# Patient Record
Sex: Female | Born: 1975 | Hispanic: No | Marital: Single | Smoking: Never smoker
Health system: Southern US, Community
[De-identification: ages and names within clinical notes are randomized; demographics above are authoritative.]

## PROBLEM LIST (undated history)

## (undated) DIAGNOSIS — Z8541 Personal history of malignant neoplasm of cervix uteri: Secondary | ICD-10-CM

## (undated) DIAGNOSIS — S62628A Displaced fracture of medial phalanx of other finger, initial encounter for closed fracture: Secondary | ICD-10-CM

## (undated) DIAGNOSIS — J45909 Unspecified asthma, uncomplicated: Secondary | ICD-10-CM

## (undated) DIAGNOSIS — D649 Anemia, unspecified: Secondary | ICD-10-CM

## (undated) DIAGNOSIS — G43909 Migraine, unspecified, not intractable, without status migrainosus: Secondary | ICD-10-CM

## (undated) DIAGNOSIS — Z8709 Personal history of other diseases of the respiratory system: Secondary | ICD-10-CM

## (undated) HISTORY — DX: Personal history of malignant neoplasm of cervix uteri: Z85.41

## (undated) HISTORY — PX: CERVICAL BIOPSY  W/ LOOP ELECTRODE EXCISION: SUR135

## (undated) HISTORY — PX: TUBAL LIGATION: SHX77

## (undated) HISTORY — PX: CHOLECYSTECTOMY: SHX55

---

## 2002-09-16 ENCOUNTER — Encounter: Payer: Self-pay | Admitting: Emergency Medicine

## 2002-09-16 ENCOUNTER — Emergency Department (HOSPITAL_COMMUNITY): Admission: EM | Admit: 2002-09-16 | Discharge: 2002-09-16 | Payer: Self-pay

## 2002-11-12 ENCOUNTER — Ambulatory Visit (HOSPITAL_COMMUNITY): Admission: RE | Admit: 2002-11-12 | Discharge: 2002-11-12 | Payer: Self-pay | Admitting: *Deleted

## 2008-08-14 ENCOUNTER — Emergency Department (HOSPITAL_COMMUNITY): Admission: EM | Admit: 2008-08-14 | Discharge: 2008-08-14 | Payer: Self-pay | Admitting: Emergency Medicine

## 2010-07-20 LAB — POCT I-STAT, CHEM 8
BUN: 9 mg/dL (ref 6–23)
Calcium, Ion: 1.17 mmol/L (ref 1.12–1.32)
Creatinine, Ser: 1 mg/dL (ref 0.4–1.2)
Glucose, Bld: 91 mg/dL (ref 70–99)
HCT: 35 % — ABNORMAL LOW (ref 36.0–46.0)
Hemoglobin: 11.9 g/dL — ABNORMAL LOW (ref 12.0–15.0)
TCO2: 25 mmol/L (ref 0–100)

## 2010-12-03 ENCOUNTER — Emergency Department (HOSPITAL_COMMUNITY)
Admission: EM | Admit: 2010-12-03 | Discharge: 2010-12-03 | Disposition: A | Discharge status: Home / Self Care | Payer: Medicaid Other | Attending: Emergency Medicine | Admitting: Emergency Medicine

## 2010-12-03 DIAGNOSIS — S335XXA Sprain of ligaments of lumbar spine, initial encounter: Secondary | ICD-10-CM | POA: Insufficient documentation

## 2010-12-03 DIAGNOSIS — X58XXXA Exposure to other specified factors, initial encounter: Secondary | ICD-10-CM | POA: Insufficient documentation

## 2010-12-03 DIAGNOSIS — J45909 Unspecified asthma, uncomplicated: Secondary | ICD-10-CM | POA: Insufficient documentation

## 2010-12-03 DIAGNOSIS — M545 Low back pain, unspecified: Secondary | ICD-10-CM | POA: Insufficient documentation

## 2012-04-20 ENCOUNTER — Emergency Department (HOSPITAL_COMMUNITY)
Admission: EM | Admit: 2012-04-20 | Discharge: 2012-04-20 | Disposition: A | Discharge status: Home / Self Care | Payer: Medicaid Other | Attending: Emergency Medicine | Admitting: Emergency Medicine

## 2012-04-20 ENCOUNTER — Encounter (HOSPITAL_COMMUNITY): Payer: Self-pay

## 2012-04-20 ENCOUNTER — Emergency Department (HOSPITAL_COMMUNITY): Payer: Medicaid Other

## 2012-04-20 DIAGNOSIS — J069 Acute upper respiratory infection, unspecified: Secondary | ICD-10-CM | POA: Insufficient documentation

## 2012-04-20 DIAGNOSIS — R5381 Other malaise: Secondary | ICD-10-CM | POA: Insufficient documentation

## 2012-04-20 DIAGNOSIS — R0602 Shortness of breath: Secondary | ICD-10-CM | POA: Insufficient documentation

## 2012-04-20 DIAGNOSIS — J3489 Other specified disorders of nose and nasal sinuses: Secondary | ICD-10-CM | POA: Insufficient documentation

## 2012-04-20 DIAGNOSIS — J029 Acute pharyngitis, unspecified: Secondary | ICD-10-CM | POA: Insufficient documentation

## 2012-04-20 DIAGNOSIS — R062 Wheezing: Secondary | ICD-10-CM | POA: Insufficient documentation

## 2012-04-20 DIAGNOSIS — R51 Headache: Secondary | ICD-10-CM | POA: Insufficient documentation

## 2012-04-20 DIAGNOSIS — R6889 Other general symptoms and signs: Secondary | ICD-10-CM

## 2012-04-20 DIAGNOSIS — R0789 Other chest pain: Secondary | ICD-10-CM | POA: Insufficient documentation

## 2012-04-20 MED ORDER — ALBUTEROL SULFATE HFA 108 (90 BASE) MCG/ACT IN AERS: 2.0000 | INHALATION_SPRAY | RESPIRATORY_TRACT | Status: DC | PRN

## 2012-04-20 MED ORDER — HYDROCODONE-HOMATROPINE 5-1.5 MG/5ML PO SYRP: 5.0000 mL | ORAL_SOLUTION | Freq: Four times a day (QID) | ORAL | Status: DC | PRN

## 2012-04-20 MED ORDER — GUAIFENESIN ER 600 MG PO TB12: 1200.0000 mg | ORAL_TABLET | Freq: Two times a day (BID) | ORAL | Status: DC

## 2012-04-20 MED ORDER — ALBUTEROL SULFATE (5 MG/ML) 0.5% IN NEBU
5.0000 mg | INHALATION_SOLUTION | Freq: Once | RESPIRATORY_TRACT | Status: AC
  Administered 2012-04-20: 5 mg via RESPIRATORY_TRACT
  Filled 2012-04-20: qty 1

## 2012-04-20 MED ORDER — IPRATROPIUM BROMIDE 0.03 % NA SOLN: 2.0000 | Freq: Two times a day (BID) | NASAL | Status: DC

## 2012-04-20 NOTE — ED Notes (Signed)
Pt. Reports that yesterday she has developed chest tightness, cough. Sore throat, ears feel like going to pop.   She has a hx of asthma and bronchitis. Pt. Is in NAD

## 2012-04-20 NOTE — ED Provider Notes (Signed)
History     CSN: [id number]  Arrival date & time 04/20/12  4098   First MD Initiated Contact with Patient 04/20/12 [phone]      Chief Complaint  Patient presents with  . Cough    (Consider location/radiation/quality/duration/timing/severity/associated sxs/prior treatment) The history is provided by the patient, the spouse and medical records.    Carla Barton is a 37 y.o. female  with a hx of asthma presents to the Emergency Department complaining of gradual, persistent, progressively worsening cough, congestion onset yesterday at 7 pm. Associated symptoms include chest tightness, scratchy throat (not sore), pressure in ears, headache, generalized body aches.  Pt states she works in [employer] ge[employer] and has been exposed to many sick patients.  Nothing makes it better and coughing and moving makes it worse.  Pt denies fever, neck pain, back pain, nausea, vomiting, diarrhea, chest pain, abdominal pain, weakness, dizziness, syncope, rash, dysuria, hematuria.     Past Medical History  Diagnosis Date  . Arthritis   . Bronchitis     Past Surgical History  Procedure Date  . Tubular ligation   . Cholecystectomy     No family history on file.  History  Substance Use Topics  . Smoking status: Never Smoker   . Smokeless tobacco: Not on file  . Alcohol Use: No    OB History    Grav Para Term Preterm Abortions TAB SAB Ect Mult Living                  Review of Systems  Constitutional: Positive for fatigue. Negative for fever, chills and appetite change.  HENT: Positive for congestion, sore throat, rhinorrhea, postnasal drip and sinus pressure. Negative for mouth sores and neck stiffness.   Eyes: Negative for visual disturbance.  Respiratory: Positive for cough, chest tightness, shortness of breath and wheezing. Negative for stridor.   Cardiovascular: Negative for chest pain, palpitations and leg swelling.  Gastrointestinal: Negative for nausea, vomiting, abdominal pain and diarrhea.    Genitourinary: Negative for dysuria, urgency, frequency and hematuria.  Musculoskeletal: Negative for myalgias, back pain and arthralgias.  Skin: Negative for rash.  Neurological: Positive for headaches. Negative for syncope, light-headedness and numbness.  Hematological: Negative for adenopathy.  Psychiatric/Behavioral: The patient is not nervous/anxious.   All other systems reviewed and are negative.    Allergies  Shellfish allergy  Home Medications   Current Outpatient Rx  Name  Route  Sig  Dispense  Refill  . ALBUTEROL SULFATE HFA 108 (90 BASE) MCG/ACT IN AERS   Inhalation   Inhale 2 puffs into the lungs every 4 (four) hours as needed for wheezing or shortness of breath.   1 Inhaler   3   . GUAIFENESIN ER 600 MG PO TB12   Oral   Take 2 tablets (1,200 mg total) by mouth 2 (two) times daily.   20 tablet   0   . HYDROCODONE-HOMATROPINE 5-1.5 MG/5ML PO SYRP   Oral   Take 5 mLs by mouth every 6 (six) hours as needed for cough.   120 mL   0   . IPRATROPIUM BROMIDE 0.03 % NA SOLN   Nasal   Place 2 sprays into the nose 2 (two) times daily. PRN congestion   30 mL   0     BP 112/71  Pulse 102  Temp 98.4 F (36.9 C) (Oral)  Resp 20  SpO2 100%  LMP 04/02/2012  Physical Exam  Constitutional: She is oriented to person, place, and time. She  appears well-developed and well-nourished. No distress.  HENT:  Head: Normocephalic and atraumatic.  Right Ear: Tympanic membrane, external ear and ear canal normal.  Left Ear: Tympanic membrane, external ear and ear canal normal.  Nose: Mucosal edema and rhinorrhea present. No epistaxis. Right sinus exhibits no maxillary sinus tenderness and no frontal sinus tenderness. Left sinus exhibits no maxillary sinus tenderness and no frontal sinus tenderness.  Mouth/Throat: Uvula is midline, oropharynx is clear and moist and mucous membranes are normal. Mucous membranes are not pale and not cyanotic. No oropharyngeal exudate, posterior  oropharyngeal edema, posterior oropharyngeal erythema or tonsillar abscesses.  Eyes: Conjunctivae normal are normal. Pupils are equal, round, and reactive to light.  Neck: Normal range of motion and full passive range of motion without pain.  Cardiovascular: Normal rate, normal heart sounds and intact distal pulses.  Exam reveals no gallop and no friction rub.   No murmur heard. Pulmonary/Chest: Effort normal. No stridor. No respiratory distress. She has decreased breath sounds (throughout). She has no wheezes. She has no rales. She exhibits tenderness (throughout the chest wall ).  Abdominal: Soft. Bowel sounds are normal. She exhibits no distension. There is no tenderness. There is no rebound and no guarding.  Musculoskeletal: Normal range of motion. She exhibits no edema and no tenderness.  Lymphadenopathy:    She has no cervical adenopathy.  Neurological: She is alert and oriented to person, place, and time. No cranial nerve deficit. She exhibits normal muscle tone. Coordination normal.  Skin: Skin is warm and dry. No rash noted. She is not diaphoretic.  Psychiatric: She has a normal mood and affect.    ED Course  Procedures (including critical care time)  Labs Reviewed - No data to display Dg Chest 2 View  04/20/2012  *RADIOLOGY REPORT*  Clinical Data: Cough  CHEST - 2 VIEW  Comparison: None.  Findings: Normal heart size.  Clear lungs.  No pleural effusion. No pneumothorax. Mild pectus excavatum deformity.  IMPRESSION: No active cardiopulmonary disease.   Original Report Authenticated By: Jolaine Click, M.D.      1. URI (upper respiratory infection)   2. Flu-like symptoms       MDM  Carla Barton presents with URI ssx.  Pt CXR negative for acute infiltrate. Patients symptoms are consistent with URI, likely viral etiology. Pt given albuterol treatment with subjective improvement in breathing and increased tidal volume.  Discussed that antibiotics are not indicated for viral  infections. Pt will be discharged with symptomatic treatment.  Verbalizes understanding and is agreeable with plan. Pt is hemodynamically stable & in NAD prior to dc.         Dahlia Client Tzippy Testerman, PA-C 04/20/12 1010

## 2012-04-20 NOTE — ED Provider Notes (Signed)
Medical screening examination/treatment/procedure(s) were performed by non-physician practitioner and as supervising physician I was immediately available for consultation/collaboration.   Niel Peretti, MD 04/20/12 1656 

## 2012-12-17 ENCOUNTER — Emergency Department (HOSPITAL_COMMUNITY)
Admission: EM | Admit: 2012-12-17 | Discharge: 2012-12-17 | Disposition: A | Discharge status: Home / Self Care | Payer: Medicaid Other | Attending: Emergency Medicine | Admitting: Emergency Medicine

## 2012-12-17 ENCOUNTER — Encounter (HOSPITAL_COMMUNITY): Payer: Self-pay | Admitting: Emergency Medicine

## 2012-12-17 DIAGNOSIS — M546 Pain in thoracic spine: Secondary | ICD-10-CM | POA: Insufficient documentation

## 2012-12-17 DIAGNOSIS — Z79899 Other long term (current) drug therapy: Secondary | ICD-10-CM | POA: Insufficient documentation

## 2012-12-17 DIAGNOSIS — M542 Cervicalgia: Secondary | ICD-10-CM | POA: Insufficient documentation

## 2012-12-17 DIAGNOSIS — M129 Arthropathy, unspecified: Secondary | ICD-10-CM | POA: Insufficient documentation

## 2012-12-17 MED ORDER — CYCLOBENZAPRINE HCL 5 MG PO TABS: 5.0000 mg | ORAL_TABLET | Freq: Three times a day (TID) | ORAL | Status: DC | PRN

## 2012-12-17 MED ORDER — NAPROXEN 500 MG PO TABS: 500.0000 mg | ORAL_TABLET | Freq: Two times a day (BID) | ORAL | Status: DC

## 2012-12-17 NOTE — ED Provider Notes (Signed)
CSN: [id number]     Arrival date & time 12/17/12  0830 History   First MD Initiated Contact with Patient 12/17/12 0957     Chief Complaint  Patient presents with  . Neck Pain    HPI  Carla Barton is a 37 y.o. female with a PMH of arthritis and bronchitis who presents to the ED for evaluation of neck pain.  History was provided by the patient.  Patient states that yesterday she woke up with left aching neck pain which radiates to her left upper back. No injury or trauma. She states that when she stands still that her pain is well-controlled. She states that when she moves her left shoulder or left neck her pain is worsened. She denies any neck swelling, sore throat, ear pain, chest pain, shortness of breath, diaphoresis, numbness, tingling, loss of sensation, or weakness. She states that she has had similar pain in her lower back in the past.  She states that she took extra strength Tylenol and naproxen with minimal relief of her symptoms. She states that she in the past has taken muscle relaxers and had success. She otherwise has been well with no fever, chills, rhinorrhea, cough, abdominal pain, nausea, vomiting, diarrhea, constipation, or leg edema.     Past Medical History  Diagnosis Date  . Arthritis   . Bronchitis    Past Surgical History  Procedure Laterality Date  . Tubular ligation    . Cholecystectomy     No family history on file. History  Substance Use Topics  . Smoking status: Never Smoker   . Smokeless tobacco: Not on file  . Alcohol Use: No   OB History   Grav Para Term Preterm Abortions TAB SAB Ect Mult Living                 Review of Systems  Constitutional: Negative for fever, chills, activity change, appetite change and fatigue.  HENT: Positive for neck pain and neck stiffness. Negative for ear pain, congestion, sore throat, facial swelling, rhinorrhea, drooling, trouble swallowing, voice change and sinus pressure.   Eyes: Negative for photophobia.   Respiratory: Negative for cough and shortness of breath.   Cardiovascular: Negative for chest pain and leg swelling.  Gastrointestinal: Negative for nausea, vomiting, abdominal pain, diarrhea and constipation.  Genitourinary: Negative for dysuria and hematuria.  Musculoskeletal: Positive for back pain (upper). Negative for gait problem.  Skin: Negative for wound.  Neurological: Negative for dizziness, syncope, weakness, light-headedness, numbness and headaches.  Psychiatric/Behavioral: Negative for confusion.    Allergies  Shellfish allergy  Home Medications   Current Outpatient Rx  Name  Route  Sig  Dispense  Refill  . acetaminophen (TYLENOL) 500 MG tablet   Oral   Take 1,000 mg by mouth every 6 (six) hours as needed for pain.         Marland Kitchen albuterol (PROVENTIL HFA;VENTOLIN HFA) 108 (90 BASE) MCG/ACT inhaler   Inhalation   Inhale 2 puffs into the lungs every 4 (four) hours as needed for wheezing or shortness of breath.   1 Inhaler   3   . naproxen (NAPROSYN) 500 MG tablet   Oral   Take 500 mg by mouth 2 (two) times daily as needed (for pain).          BP 165/102  Pulse 71  Temp(Src) 97 F (36.1 C) (Oral)  SpO2 100%  LMP 12/02/2012  Filed Vitals:   12/17/12 0850 12/17/12 1022  BP: 165/102 140/96  Pulse:  71 61  Temp: 97 F (36.1 C)   TempSrc: Oral   SpO2: 100% 100%    Physical Exam  Nursing note and vitals reviewed. Constitutional: She is oriented to person, place, and time. She appears well-developed and well-nourished. No distress.  Patient non-toxic in appearance   HENT:  Head: Normocephalic and atraumatic.  Right Ear: External ear normal.  Left Ear: External ear normal.  Nose: Nose normal.  Mouth/Throat: Oropharynx is clear and moist. No oropharyngeal exudate.  Tm's gray and translucent bilaterally  Eyes: Conjunctivae are normal. Pupils are equal, round, and reactive to light. Right eye exhibits no discharge. Left eye exhibits no discharge.  Neck:  Neck supple.  Diffuse tenderness to palpation to the left trapezius throughout.  No thoracic or cervical spinal tenderness.  No palpable masses to the neck throughout.  Neck is symmetrical without any edema, erythema, ecchymosis, or wounds. ROM limited due to pain.    Cardiovascular: Normal rate, regular rhythm, normal heart sounds and intact distal pulses.  Exam reveals no gallop and no friction rub.   No murmur heard. Radial pulses present and equal bilaterally  Pulmonary/Chest: Effort normal and breath sounds normal. No respiratory distress. She has no wheezes. She has no rales. She exhibits no tenderness.  Abdominal: Soft. Bowel sounds are normal. She exhibits no distension. There is no tenderness.  Musculoskeletal: Normal range of motion. She exhibits no edema and no tenderness.  Grip strength 5/5 bilaterally.  Left shoulder ROM limited due to left neck pain.    Neurological: She is alert and oriented to person, place, and time.  Gross sensation intact in the upper extremities bilaterally  Skin: Skin is warm and dry. She is not diaphoretic.        ED Course  Procedures (including critical care time) Labs Review Labs Reviewed - No data to display Imaging Review No results found.   MDM   1. Neck pain on left side      Carla Barton is a 37 y.o. female with a PMH of arthritis and bronchitis who presents to the ED for evaluation of neck pain.     Etiology of neck pain is possible due to a muscle strain.  Patient was neurovascularly intact.  She had diffuse muscular tenderness to palpation, which is worse with movement.  No x-rays were indicated due to no hx of trauma.  She was afebrile in the ED, non-toxic, and remained in no acute distress throughout her ED.  She was prescribed flexeril and naprosyn for outpatient management.  She was in instructed not to drink alcohol or drive while taking flexeril.  She was instructed to return to the ED if she experiences any fever,  numbness/tingling, loss of sensation, weakness, difficulty breathing/swallowing, or other concerns.  She was instructed to follow-up with her PCP this week if her symptoms are not improving in 2 days.  Patient was in agreement with discharge and plan.     Final impressions: 1. Neck pain, left     Greer Ee Sherol Sabas PA-C           Jillyn Ledger, PA-C 12/18/12 1015

## 2012-12-17 NOTE — ED Notes (Signed)
Pt c/o left neck and shoulder pain. Onset yesterday while sitting. No known injury.

## 2012-12-19 NOTE — ED Provider Notes (Signed)
Medical screening examination/treatment/procedure(s) were performed by non-physician practitioner and as supervising physician I was immediately available for consultation/collaboration.   David Masneri, MD 12/19/12 1844 

## 2012-12-28 ENCOUNTER — Emergency Department (HOSPITAL_COMMUNITY)
Admission: EM | Admit: 2012-12-28 | Discharge: 2012-12-28 | Disposition: A | Discharge status: Home / Self Care | Payer: Medicaid Other | Attending: Emergency Medicine | Admitting: Emergency Medicine

## 2012-12-28 ENCOUNTER — Encounter (HOSPITAL_COMMUNITY): Payer: Self-pay | Admitting: Emergency Medicine

## 2012-12-28 DIAGNOSIS — H9319 Tinnitus, unspecified ear: Secondary | ICD-10-CM | POA: Insufficient documentation

## 2012-12-28 DIAGNOSIS — M129 Arthropathy, unspecified: Secondary | ICD-10-CM | POA: Insufficient documentation

## 2012-12-28 DIAGNOSIS — J329 Chronic sinusitis, unspecified: Secondary | ICD-10-CM | POA: Insufficient documentation

## 2012-12-28 DIAGNOSIS — Z791 Long term (current) use of non-steroidal anti-inflammatories (NSAID): Secondary | ICD-10-CM | POA: Insufficient documentation

## 2012-12-28 DIAGNOSIS — Z8669 Personal history of other diseases of the nervous system and sense organs: Secondary | ICD-10-CM | POA: Insufficient documentation

## 2012-12-28 DIAGNOSIS — Z79899 Other long term (current) drug therapy: Secondary | ICD-10-CM | POA: Insufficient documentation

## 2012-12-28 MED ORDER — AMOXICILLIN 500 MG PO CAPS: 500.0000 mg | ORAL_CAPSULE | Freq: Three times a day (TID) | ORAL | Status: DC

## 2012-12-28 NOTE — ED Notes (Signed)
Pt reports having a head cold since Monday and ringing in left ear onset yesterday. Pt reports that her daughter was ill Friday and Saturday.

## 2012-12-28 NOTE — ED Provider Notes (Signed)
CSN: [id number]     Arrival date & time 12/28/12  0804 History   First MD Initiated Contact with Patient 12/28/12 0818     Chief Complaint  Patient presents with  . URI  . Tinnitus   (Consider location/radiation/quality/duration/timing/severity/associated sxs/prior Treatment) HPI Comments: Patient presents emergency department with chief complaint of sinus congestion and left ear tinnitus. She states that her symptoms began on Monday, and have progressively worsened. Associated symptoms include nasal congestion, runny nose, sore throat, and cough. She has tried taking Sudafed with some relief. He states that when she woke this morning she had a feeling of ear fullness and tinnitus. Her symptoms are mild to moderate.  The history is provided by the patient. No language interpreter was used.    Past Medical History  Diagnosis Date  . Arthritis   . Bronchitis    Past Surgical History  Procedure Laterality Date  . Tubular ligation    . Cholecystectomy     No family history on file. History  Substance Use Topics  . Smoking status: Never Smoker   . Smokeless tobacco: Not on file  . Alcohol Use: No   OB History   Grav Para Term Preterm Abortions TAB SAB Ect Mult Living                 Review of Systems  All other systems reviewed and are negative.    Allergies  Shellfish allergy and Asa  Home Medications   Current Outpatient Rx  Name  Route  Sig  Dispense  Refill  . acetaminophen (TYLENOL) 500 MG tablet   Oral   Take 1,000 mg by mouth every 6 (six) hours as needed for pain.         Marland Kitchen albuterol (PROVENTIL HFA;VENTOLIN HFA) 108 (90 BASE) MCG/ACT inhaler   Inhalation   Inhale 2 puffs into the lungs every 4 (four) hours as needed for wheezing or shortness of breath.   1 Inhaler   3   . cyclobenzaprine (FLEXERIL) 5 MG tablet   Oral   Take 1 tablet (5 mg total) by mouth 3 (three) times daily as needed for muscle spasms.   15 tablet   0   . naproxen (NAPROSYN) 500  MG tablet   Oral   Take 1 tablet (500 mg total) by mouth 2 (two) times daily with a meal.   15 tablet   0   . pseudoephedrine (SUDAFED) 30 MG tablet   Oral   Take 60 mg by mouth every 4 (four) hours as needed for congestion.         Marland Kitchen amoxicillin (AMOXIL) 500 MG capsule   Oral   Take 1 capsule (500 mg total) by mouth 3 (three) times daily.   30 capsule   0    BP 144/98  Pulse 86  Temp(Src) 97.8 F (36.6 C) (Oral)  Resp 22  Ht 5\' 7"  (1.702 m)  Wt 271 lb 4 oz (123.038 kg)  BMI 42.47 kg/m2  SpO2 100%  LMP 12/02/2012 Physical Exam  Nursing note and vitals reviewed. Constitutional: She is oriented to person, place, and time. She appears well-developed and well-nourished.  HENT:  Head: Normocephalic and atraumatic.  Right Ear: External ear normal.  Left Ear: External ear normal.  Mouth/Throat: Oropharynx is clear and moist. No oropharyngeal exudate.  Swollen, erythematous turbinates, maxillary sinuses tender to palpation  Left tympanic membrane is mildly erythematous, with visible congestion behind it.  Eyes: Conjunctivae and EOM are normal. Pupils are  equal, round, and reactive to light.  Neck: Normal range of motion. Neck supple.  Cardiovascular: Normal rate, regular rhythm and normal heart sounds.   Pulmonary/Chest: Effort normal and breath sounds normal. No respiratory distress. She has no wheezes. She has no rales. She exhibits no tenderness.  Abdominal: Soft. Bowel sounds are normal.  Musculoskeletal: Normal range of motion.  Neurological: She is alert and oriented to person, place, and time.  Skin: Skin is warm and dry.  Psychiatric: She has a normal mood and affect. Her behavior is normal. Judgment and thought content normal.    ED Course  Procedures (including critical care time) Labs Review Labs Reviewed - No data to display Imaging Review No results found.  MDM   1. Sinusitis    Patient with sinusitis. Treat with amoxicillin. Continue rest, and  fluids. Recommend PCP followup.     Roxy Horseman, PA-C 12/28/12 [phone]

## 2012-12-28 NOTE — ED Provider Notes (Signed)
Medical screening examination/treatment/procedure(s) were performed by non-physician practitioner and as supervising physician I was immediately available for consultation/collaboration.   Candyce Churn, MD 12/28/12 [phone]

## 2013-03-06 ENCOUNTER — Emergency Department (HOSPITAL_COMMUNITY)
Admission: EM | Admit: 2013-03-06 | Discharge: 2013-03-06 | Disposition: A | Discharge status: Home / Self Care | Payer: Medicaid Other | Attending: Emergency Medicine | Admitting: Emergency Medicine

## 2013-03-06 ENCOUNTER — Encounter (HOSPITAL_COMMUNITY): Payer: Self-pay | Admitting: Emergency Medicine

## 2013-03-06 DIAGNOSIS — Z8739 Personal history of other diseases of the musculoskeletal system and connective tissue: Secondary | ICD-10-CM | POA: Insufficient documentation

## 2013-03-06 DIAGNOSIS — N760 Acute vaginitis: Secondary | ICD-10-CM | POA: Insufficient documentation

## 2013-03-06 DIAGNOSIS — A499 Bacterial infection, unspecified: Secondary | ICD-10-CM | POA: Insufficient documentation

## 2013-03-06 DIAGNOSIS — Z3202 Encounter for pregnancy test, result negative: Secondary | ICD-10-CM | POA: Insufficient documentation

## 2013-03-06 DIAGNOSIS — B9689 Other specified bacterial agents as the cause of diseases classified elsewhere: Secondary | ICD-10-CM | POA: Insufficient documentation

## 2013-03-06 DIAGNOSIS — R109 Unspecified abdominal pain: Secondary | ICD-10-CM | POA: Insufficient documentation

## 2013-03-06 DIAGNOSIS — N938 Other specified abnormal uterine and vaginal bleeding: Secondary | ICD-10-CM | POA: Insufficient documentation

## 2013-03-06 DIAGNOSIS — N390 Urinary tract infection, site not specified: Secondary | ICD-10-CM | POA: Insufficient documentation

## 2013-03-06 DIAGNOSIS — Z79899 Other long term (current) drug therapy: Secondary | ICD-10-CM | POA: Insufficient documentation

## 2013-03-06 DIAGNOSIS — Z8709 Personal history of other diseases of the respiratory system: Secondary | ICD-10-CM | POA: Insufficient documentation

## 2013-03-06 DIAGNOSIS — N949 Unspecified condition associated with female genital organs and menstrual cycle: Secondary | ICD-10-CM | POA: Insufficient documentation

## 2013-03-06 LAB — CBC WITH DIFFERENTIAL/PLATELET
Basophils Absolute: 0 10*3/uL (ref 0.0–0.1)
Eosinophils Relative: 3 % (ref 0–5)
HCT: 35.5 % — ABNORMAL LOW (ref 36.0–46.0)
Hemoglobin: 11.2 g/dL — ABNORMAL LOW (ref 12.0–15.0)
Lymphocytes Relative: 22 % (ref 12–46)
Lymphs Abs: 2.6 10*3/uL (ref 0.7–4.0)
MCV: 84.5 fL (ref 78.0–100.0)
Monocytes Absolute: 0.5 10*3/uL (ref 0.1–1.0)
Neutro Abs: 8.3 10*3/uL — ABNORMAL HIGH (ref 1.7–7.7)
RBC: 4.2 MIL/uL (ref 3.87–5.11)
RDW: 14.8 % (ref 11.5–15.5)
WBC: 11.7 10*3/uL — ABNORMAL HIGH (ref 4.0–10.5)

## 2013-03-06 LAB — URINALYSIS, ROUTINE W REFLEX MICROSCOPIC
Glucose, UA: NEGATIVE mg/dL
Ketones, ur: NEGATIVE mg/dL
Nitrite: NEGATIVE
Specific Gravity, Urine: 1.029 (ref 1.005–1.030)
pH: 5 (ref 5.0–8.0)

## 2013-03-06 LAB — POCT I-STAT, CHEM 8
Creatinine, Ser: 1.1 mg/dL (ref 0.50–1.10)
Glucose, Bld: 86 mg/dL (ref 70–99)
HCT: 35 % — ABNORMAL LOW (ref 36.0–46.0)
Hemoglobin: 11.9 g/dL — ABNORMAL LOW (ref 12.0–15.0)
Sodium: 141 mEq/L (ref 135–145)
TCO2: 29 mmol/L (ref 0–100)

## 2013-03-06 LAB — WET PREP, GENITAL: Yeast Wet Prep HPF POC: NONE SEEN

## 2013-03-06 LAB — HCG, SERUM, QUALITATIVE: Preg, Serum: NEGATIVE

## 2013-03-06 MED ORDER — METRONIDAZOLE 500 MG PO TABS: 500.0000 mg | ORAL_TABLET | Freq: Two times a day (BID) | ORAL | Status: DC

## 2013-03-06 MED ORDER — SULFAMETHOXAZOLE-TRIMETHOPRIM 800-160 MG PO TABS: 1.0000 | ORAL_TABLET | Freq: Two times a day (BID) | ORAL | Status: DC

## 2013-03-06 NOTE — ED Notes (Signed)
Pt and family given [place] sandwiches and drinks

## 2013-03-06 NOTE — ED Provider Notes (Signed)
CSN: [id number]     Arrival date & time 03/06/13  1022 History   First MD Initiated Contact with Patient 03/06/13 1037     Chief Complaint  Patient presents with  . Vaginal Bleeding   (Consider location/radiation/quality/duration/timing/severity/associated sxs/prior Treatment) HPI Comments: Patient with a history of BTL presents to the ED after starting her period on the 14th and having it last one day and then starting again 2 days ago with mild cramping.  She states that she called her GYN who told her she still could be pregnant.  She states that her menstrual cycles are usually very regular.  She reports 3 children in the past but states that with each one, she had negative urine pregnancy tests and her tests had to be done with blood.  She is concerned that she is pregnant and this is causing the cramping and bleeding.  She reports that the cramps are lighter than normal menstrual cramps.  She denies fever, chills, nausea, vomiting, dysuria, hematuria, constipation or diarrhea.  Patient is a 37 y.o. female presenting with vaginal bleeding. The history is provided by the patient. No language interpreter was used.  Vaginal Bleeding Quality:  Typical of menses Severity:  Mild Onset quality:  Sudden Duration:  2 days Timing:  Constant Progression:  Worsening Chronicity:  New Menstrual history:  Regular Number of pads used:  2 Possible pregnancy: yes   Relieved by:  Nothing Worsened by:  Nothing tried Ineffective treatments:  None tried Associated symptoms: abdominal pain   Associated symptoms: no back pain, no dizziness, no dyspareunia, no dysuria, no fatigue, no fever, no nausea and no vaginal discharge   Risk factors: no new sexual partner, no prior miscarriage and no terminated pregnancies     Past Medical History  Diagnosis Date  . Arthritis   . Bronchitis    Past Surgical History  Procedure Laterality Date  . Tubular ligation    . Cholecystectomy     History reviewed.  No pertinent family history. History  Substance Use Topics  . Smoking status: Never Smoker   . Smokeless tobacco: Not on file  . Alcohol Use: No   OB History   Grav Para Term Preterm Abortions TAB SAB Ect Mult Living                 Review of Systems  Constitutional: Negative for fever and fatigue.  Gastrointestinal: Positive for abdominal pain. Negative for nausea.  Genitourinary: Positive for vaginal bleeding. Negative for dysuria, vaginal discharge and dyspareunia.  Musculoskeletal: Negative for back pain.  Neurological: Negative for dizziness.  All other systems reviewed and are negative.    Allergies  Shellfish allergy and Asa  Home Medications   Current Outpatient Rx  Name  Route  Sig  Dispense  Refill  . albuterol (PROVENTIL HFA;VENTOLIN HFA) 108 (90 BASE) MCG/ACT inhaler   Inhalation   Inhale 2 puffs into the lungs every 4 (four) hours as needed for wheezing or shortness of breath.   1 Inhaler   3    BP 131/73  Pulse 85  Temp(Src) 98.2 F (36.8 C) (Oral)  Resp 18  SpO2 97%  LMP 01/29/2013 Physical Exam  Nursing note and vitals reviewed. Constitutional: She is oriented to person, place, and time. She appears well-developed and well-nourished. No distress.  HENT:  Head: Normocephalic and atraumatic.  Right Ear: External ear normal.  Left Ear: External ear normal.  Nose: Nose normal.  Mouth/Throat: Oropharynx is clear and moist. No oropharyngeal exudate.  Eyes: Conjunctivae are normal. Pupils are equal, round, and reactive to light. No scleral icterus.  Neck: Normal range of motion. Neck supple.  Cardiovascular: Normal rate, regular rhythm and normal heart sounds.  Exam reveals no gallop and no friction rub.   No murmur heard. Pulmonary/Chest: Effort normal and breath sounds normal. No respiratory distress. She has no wheezes. She has no rales. She exhibits no tenderness.  Abdominal: Soft. Bowel sounds are normal. She exhibits no distension. There is no  tenderness. There is no rebound and no guarding.  Genitourinary: Vagina normal and uterus normal. There is no rash or tenderness on the right labia. There is no rash or tenderness on the left labia. Uterus is not tender. Cervix exhibits no motion tenderness, no discharge and no friability. Right adnexum displays tenderness. Right adnexum displays no mass. Left adnexum displays no mass and no tenderness. No bleeding around the vagina. No vaginal discharge found.  Musculoskeletal: Normal range of motion. She exhibits no edema and no tenderness.  Lymphadenopathy:    She has no cervical adenopathy.  Neurological: She is alert and oriented to person, place, and time. She exhibits normal muscle tone. Coordination normal.  Skin: Skin is warm and dry. No rash noted. No erythema. No pallor.  Psychiatric: She has a normal mood and affect. Her behavior is normal. Judgment and thought content normal.    ED Course  Procedures (including critical care time) Labs Review Labs Reviewed  CBC WITH DIFFERENTIAL - Abnormal; Notable for the following:    WBC 11.7 (*)    Hemoglobin 11.2 (*)    HCT 35.5 (*)    Platelets 441 (*)    Neutro Abs 8.3 (*)    All other components within normal limits  POCT I-STAT, CHEM 8 - Abnormal; Notable for the following:    Calcium, Ion 1.29 (*)    Hemoglobin 11.9 (*)    HCT 35.0 (*)    All other components within normal limits  WET PREP, GENITAL  GC/CHLAMYDIA PROBE AMP  URINALYSIS, ROUTINE W REFLEX MICROSCOPIC  HCG, SERUM, QUALITATIVE  POCT PREGNANCY, URINE   Imaging Review No results found.  EKG Interpretation   None      Results for orders placed during the hospital encounter of 03/06/13  WET PREP, GENITAL      Result Value Range   Yeast Wet Prep HPF POC NONE SEEN  NONE SEEN   Trich, Wet Prep NONE SEEN  NONE SEEN   Clue Cells Wet Prep HPF POC FEW (*) NONE SEEN   WBC, Wet Prep HPF POC MODERATE (*) NONE SEEN  URINALYSIS, ROUTINE W REFLEX MICROSCOPIC       Result Value Range   Color, Urine YELLOW  YELLOW   APPearance CLOUDY (*) CLEAR   Specific Gravity, Urine 1.029  1.005 - 1.030   pH 5.0  5.0 - 8.0   Glucose, UA NEGATIVE  NEGATIVE mg/dL   Hgb urine dipstick LARGE (*) NEGATIVE   Bilirubin Urine SMALL (*) NEGATIVE   Ketones, ur NEGATIVE  NEGATIVE mg/dL   Protein, ur NEGATIVE  NEGATIVE mg/dL   Urobilinogen, UA 0.2  0.0 - 1.0 mg/dL   Nitrite NEGATIVE  NEGATIVE   Leukocytes, UA MODERATE (*) NEGATIVE  HCG, SERUM, QUALITATIVE      Result Value Range   Preg, Serum NEGATIVE  NEGATIVE  CBC WITH DIFFERENTIAL      Result Value Range   WBC 11.7 (*) 4.0 - 10.5 K/uL   RBC 4.20  3.87 - 5.11  MIL/uL   Hemoglobin 11.2 (*) 12.0 - 15.0 g/dL   HCT 84.1 (*) 32.4 - 40.1 %   MCV 84.5  78.0 - 100.0 fL   MCH 26.7  26.0 - 34.0 pg   MCHC 31.5  30.0 - 36.0 g/dL   RDW 02.7  25.3 - 66.4 %   Platelets 441 (*) 150 - 400 K/uL   Neutrophils Relative % 70  43 - 77 %   Neutro Abs 8.3 (*) 1.7 - 7.7 K/uL   Lymphocytes Relative 22  12 - 46 %   Lymphs Abs 2.6  0.7 - 4.0 K/uL   Monocytes Relative 4  3 - 12 %   Monocytes Absolute 0.5  0.1 - 1.0 K/uL   Eosinophils Relative 3  0 - 5 %   Eosinophils Absolute 0.4  0.0 - 0.7 K/uL   Basophils Relative 0  0 - 1 %   Basophils Absolute 0.0  0.0 - 0.1 K/uL  URINE MICROSCOPIC-ADD ON      Result Value Range   Squamous Epithelial / LPF MANY (*) RARE   WBC, UA 7-10  <3 WBC/hpf   RBC / HPF 3-6  <3 RBC/hpf   Bacteria, UA MANY (*) RARE  POCT PREGNANCY, URINE      Result Value Range   Preg Test, Ur NEGATIVE  NEGATIVE  POCT I-STAT, CHEM 8      Result Value Range   Sodium 141  135 - 145 mEq/L   Potassium 4.2  3.5 - 5.1 mEq/L   Chloride 103  96 - 112 mEq/L   BUN 10  6 - 23 mg/dL   Creatinine, Ser 4.03  0.50 - 1.10 mg/dL   Glucose, Bld 86  70 - 99 mg/dL   Calcium, Ion 4.74 (*) 1.12 - 1.23 mmol/L   TCO2 29  0 - 100 mmol/L   Hemoglobin 11.9 (*) 12.0 - 15.0 g/dL   HCT 25.9 (*) 56.3 - 87.5 %   No results found.   MDM   BV UTI DUB  Patient with history of BTL presents with concerns of incidental pregnancy and DUB.  Pelvic examination here reveals no current bleeding, labs c/w UTI and BV.  Will treat for this and she may follow up with PCP for further evaluation.   Izola Price Marisue Humble, PA-C 03/06/13 1355

## 2013-03-06 NOTE — ED Provider Notes (Signed)
Medical screening examination/treatment/procedure(s) were performed by non-physician practitioner and as supervising physician I was immediately available for consultation/collaboration.  EKG Interpretation   None         Fenna Semel M Cylinda Santoli, MD 03/06/13 1658 

## 2013-03-06 NOTE — ED Notes (Signed)
Pt reports lmp was 11/14 but it only lasted one day and was very light, which is abnormal for her. Now having spotting this am, no cramping or pain. reprots last normal period was 10/21

## 2013-03-08 LAB — URINE CULTURE: Colony Count: 40000

## 2013-03-09 LAB — GC/CHLAMYDIA PROBE AMP: CT Probe RNA: POSITIVE — AB

## 2013-03-10 ENCOUNTER — Telehealth (HOSPITAL_COMMUNITY): Payer: Self-pay | Admitting: Emergency Medicine

## 2013-03-10 NOTE — ED Notes (Signed)
+  Chlamydia. Chart sent to EDP office for review. [employer]HS attached. 

## 2013-03-10 NOTE — ED Notes (Signed)
Post ED Visit - Positive Culture Follow-up  Culture report reviewed by antimicrobial stewardship pharmacist: []  Wes Dulaney, Pharm.D., BCPS []  Celedonio Miyamoto, Pharm.D., BCPS []  Georgina Pillion, Pharm.D., BCPS []  [place], [address].D., BCPS, AAHIVP [x]  Estella Husk, Pharm.D., BCPS, AAHIVP  Positive urine culture Treated with Sulfa-Trimeth, organism sensitive to the same and no further patient follow-up is required at this time.  Kylie A Holland 03/10/2013, 12:30 PM

## 2013-03-10 NOTE — ED Notes (Signed)
Patient has +Chlamydia. 

## 2013-03-30 ENCOUNTER — Encounter (HOSPITAL_COMMUNITY): Payer: Self-pay | Admitting: Emergency Medicine

## 2013-03-30 ENCOUNTER — Emergency Department (HOSPITAL_COMMUNITY)
Admission: EM | Admit: 2013-03-30 | Discharge: 2013-03-30 | Disposition: A | Discharge status: Home / Self Care | Payer: Medicaid Other | Attending: Emergency Medicine | Admitting: Emergency Medicine

## 2013-03-30 DIAGNOSIS — Z8709 Personal history of other diseases of the respiratory system: Secondary | ICD-10-CM | POA: Insufficient documentation

## 2013-03-30 DIAGNOSIS — L259 Unspecified contact dermatitis, unspecified cause: Secondary | ICD-10-CM | POA: Insufficient documentation

## 2013-03-30 DIAGNOSIS — M2550 Pain in unspecified joint: Secondary | ICD-10-CM | POA: Insufficient documentation

## 2013-03-30 DIAGNOSIS — Z8739 Personal history of other diseases of the musculoskeletal system and connective tissue: Secondary | ICD-10-CM | POA: Insufficient documentation

## 2013-03-30 MED ORDER — PREDNISONE 20 MG PO TABS: 40.0000 mg | ORAL_TABLET | Freq: Every day | ORAL | Status: DC

## 2013-03-30 MED ORDER — PREDNISONE 20 MG PO TABS
60.0000 mg | ORAL_TABLET | Freq: Once | ORAL | Status: AC
  Administered 2013-03-30: 60 mg via ORAL
  Filled 2013-03-30: qty 3

## 2013-03-30 NOTE — ED Notes (Signed)
No resp distress-- hives across chest and shoulders.

## 2013-03-30 NOTE — ED Notes (Signed)
HIves started last night- no changes anything at home--no new laundry soap, no different foods, has been under a great deal of stress. No resp distress.

## 2013-03-30 NOTE — ED Provider Notes (Addendum)
CSN: [id number]     Arrival date & time 03/30/13  0757 History   First MD Initiated Contact with Patient 03/30/13 0803     No chief complaint on file.  (Consider location/radiation/quality/duration/timing/severity/associated sxs/prior Treatment) Patient is a 37 y.o. female presenting with rash. The history is provided by the patient.  Rash Location:  Shoulder/arm and torso Shoulder/arm rash location:  R shoulder and R arm Torso rash location:  R chest Quality: itchiness and redness   Severity:  Moderate Onset quality:  Sudden Duration:  12 hours Timing:  Constant Progression:  Unchanged Chronicity:  New Context: not animal contact, not chemical exposure, not exposure to similar rash, not food, not insect bite/sting, not medications, not new detergent/soap and not plant contact   Relieved by:  Antihistamines Worsened by:  Nothing tried Associated symptoms: joint pain   Associated symptoms: no fever, no shortness of breath and not wheezing     Past Medical History  Diagnosis Date  . Arthritis   . Bronchitis    Past Surgical History  Procedure Laterality Date  . Tubular ligation    . Cholecystectomy     No family history on file. History  Substance Use Topics  . Smoking status: Never Smoker   . Smokeless tobacco: Not on file  . Alcohol Use: No   OB History   Grav Para Term Preterm Abortions TAB SAB Ect Mult Living                 Review of Systems  Constitutional: Negative for fever.  Respiratory: Negative for shortness of breath and wheezing.   Musculoskeletal: Positive for arthralgias.  Skin: Positive for rash.  All other systems reviewed and are negative.    Allergies  Shellfish allergy and Asa  Home Medications   Current Outpatient Rx  Name  Route  Sig  Dispense  Refill  . albuterol (PROVENTIL HFA;VENTOLIN HFA) 108 (90 BASE) MCG/ACT inhaler   Inhalation   Inhale 2 puffs into the lungs every 4 (four) hours as needed for wheezing or shortness of  breath.   1 Inhaler   3    LMP 01/29/2013 Physical Exam  Nursing note and vitals reviewed. Constitutional: She is oriented to person, place, and time. She appears well-developed and well-nourished. No distress.  HENT:  Head: Normocephalic and atraumatic.  Mouth/Throat: Oropharynx is clear and moist.  Eyes: Conjunctivae and EOM are normal. Pupils are equal, round, and reactive to light.  Neck: Normal range of motion. Neck supple.  Cardiovascular: Normal rate.   Pulmonary/Chest: Effort normal.  Musculoskeletal: Normal range of motion. She exhibits no edema and no tenderness.  Neurological: She is alert and oriented to person, place, and time.  Skin: Skin is warm and dry. Lesion and rash noted. No purpura noted. Rash is papular. Rash is not vesicular. There is erythema.     Numerous papular excoriated lesions over the right shoulder and chest.  Psychiatric: She has a normal mood and affect. Her behavior is normal.    ED Course  Procedures (including critical care time) Labs Review Labs Reviewed - No data to display Imaging Review No results found.  EKG Interpretation   None       MDM   1. Contact dermatitis     Patient here with a rash that is most consistent with either with a contact dermatitis versus a bite of some kind. The rash is pruritic and there is no evidence of infection.  Will have patient use hydrocortisone cream, Benadryl  and given prednisone.    Gwyneth Sprout, MD 03/30/13 1610  Gwyneth Sprout, MD 03/30/13 [phone]

## 2013-04-06 ENCOUNTER — Encounter (HOSPITAL_COMMUNITY): Payer: Self-pay | Admitting: Emergency Medicine

## 2013-04-06 ENCOUNTER — Emergency Department (HOSPITAL_COMMUNITY): Payer: Medicaid Other

## 2013-04-06 ENCOUNTER — Emergency Department (HOSPITAL_COMMUNITY)
Admission: EM | Admit: 2013-04-06 | Discharge: 2013-04-06 | Disposition: A | Discharge status: Home / Self Care | Payer: Medicaid Other | Attending: Emergency Medicine | Admitting: Emergency Medicine

## 2013-04-06 DIAGNOSIS — J36 Peritonsillar abscess: Secondary | ICD-10-CM

## 2013-04-06 DIAGNOSIS — M542 Cervicalgia: Secondary | ICD-10-CM | POA: Insufficient documentation

## 2013-04-06 DIAGNOSIS — M129 Arthropathy, unspecified: Secondary | ICD-10-CM | POA: Insufficient documentation

## 2013-04-06 DIAGNOSIS — J039 Acute tonsillitis, unspecified: Secondary | ICD-10-CM | POA: Insufficient documentation

## 2013-04-06 DIAGNOSIS — R000 Tachycardia, unspecified: Secondary | ICD-10-CM | POA: Insufficient documentation

## 2013-04-06 DIAGNOSIS — Z79899 Other long term (current) drug therapy: Secondary | ICD-10-CM | POA: Insufficient documentation

## 2013-04-06 DIAGNOSIS — H9209 Otalgia, unspecified ear: Secondary | ICD-10-CM | POA: Insufficient documentation

## 2013-04-06 LAB — POCT I-STAT, CHEM 8
Calcium, Ion: 1.19 mmol/L (ref 1.12–1.23)
Chloride: 101 mEq/L (ref 96–112)
HCT: 42 % (ref 36.0–46.0)
Sodium: 139 mEq/L (ref 135–145)
TCO2: 24 mmol/L (ref 0–100)

## 2013-04-06 LAB — RAPID STREP SCREEN (MED CTR MEBANE ONLY): Streptococcus, Group A Screen (Direct): NEGATIVE

## 2013-04-06 MED ORDER — IOHEXOL 300 MG/ML  SOLN
75.0000 mL | Freq: Once | INTRAMUSCULAR | Status: AC | PRN
  Administered 2013-04-06: 75 mL via INTRAVENOUS

## 2013-04-06 MED ORDER — CLINDAMYCIN PHOSPHATE 600 MG/50ML IV SOLN
600.0000 mg | Freq: Once | INTRAVENOUS | Status: AC
  Administered 2013-04-06: 600 mg via INTRAVENOUS
  Filled 2013-04-06: qty 50

## 2013-04-06 MED ORDER — SODIUM CHLORIDE 0.9 % IV BOLUS (SEPSIS)
1000.0000 mL | Freq: Once | INTRAVENOUS | Status: AC
  Administered 2013-04-06: 1000 mL via INTRAVENOUS

## 2013-04-06 MED ORDER — ACETAMINOPHEN 325 MG PO TABS
650.0000 mg | ORAL_TABLET | Freq: Four times a day (QID) | ORAL | Status: DC | PRN
  Administered 2013-04-06: 650 mg via ORAL

## 2013-04-06 MED ORDER — CEPHALEXIN 250 MG/5ML PO SUSR: 500.0000 mg | Freq: Two times a day (BID) | ORAL | Status: DC

## 2013-04-06 MED ORDER — MORPHINE SULFATE 2 MG/ML IJ SOLN
2.0000 mg | Freq: Once | INTRAMUSCULAR | Status: AC
  Administered 2013-04-06: 2 mg via INTRAVENOUS
  Filled 2013-04-06: qty 1

## 2013-04-06 MED ORDER — HYDROCODONE-ACETAMINOPHEN 7.5-325 MG/15ML PO SOLN: 15.0000 mL | Freq: Four times a day (QID) | ORAL | Status: DC | PRN

## 2013-04-06 MED ORDER — CLINDAMYCIN PALMITATE HCL 75 MG/5ML PO SOLR: 300.0000 mg | Freq: Three times a day (TID) | ORAL | Status: DC

## 2013-04-06 MED ORDER — DEXAMETHASONE SODIUM PHOSPHATE 4 MG/ML IJ SOLN
10.0000 mg | Freq: Once | INTRAMUSCULAR | Status: AC
  Administered 2013-04-06: 10 mg via INTRAVENOUS
  Filled 2013-04-06: qty 3

## 2013-04-06 MED ORDER — ACETAMINOPHEN 325 MG PO TABS: 325.0000 mg | ORAL_TABLET | Freq: Once | ORAL | Status: DC

## 2013-04-06 MED ORDER — ACETAMINOPHEN 325 MG PO TABS
ORAL_TABLET | ORAL | Status: AC
  Filled 2013-04-06: qty 2

## 2013-04-06 NOTE — ED Notes (Signed)
Per pt sore throat and ear pain.

## 2013-04-06 NOTE — ED Provider Notes (Signed)
CSN: [id number]     Arrival date & time 04/06/13  1101 History   First MD Initiated Contact with Patient 04/06/13 1159     Chief Complaint  Patient presents with  . Sore Throat   (Consider location/radiation/quality/duration/timing/severity/associated sxs/prior Treatment) Patient is a 37 y.o. female presenting with pharyngitis.  Sore Throat Associated symptoms include neck pain (anterior) and a sore throat.    37 yo female presents with acute onset sore throat that started yesterday morning and is progressively worsening. Patient describes sharp pain with swallowing that is constant. Admits to anterior neck tenderness. Denies cough or congestion. Denies sick contacts. Admits to fever/chills along with frontal HA.  Denies CP, Dyspnea, N/V, Abdominal pain. Patient has not tried any medications for sxs.  Past Medical History  Diagnosis Date  . Arthritis   . Bronchitis    Past Surgical History  Procedure Laterality Date  . Tubular ligation    . Cholecystectomy     History reviewed. No pertinent family history. History  Substance Use Topics  . Smoking status: Never Smoker   . Smokeless tobacco: Not on file  . Alcohol Use: No   OB History   Grav Para Term Preterm Abortions TAB SAB Ect Mult Living                 Review of Systems  HENT: Positive for ear pain (LEFT), sore throat and trouble swallowing. Negative for rhinorrhea and sinus pressure.   Musculoskeletal: Positive for neck pain (anterior). Negative for neck stiffness.  All other systems reviewed and are negative.    Allergies  Shellfish allergy and Asa  Home Medications   Current Outpatient Rx  Name  Route  Sig  Dispense  Refill  . albuterol (PROVENTIL HFA;VENTOLIN HFA) 108 (90 BASE) MCG/ACT inhaler   Inhalation   Inhale 2 puffs into the lungs every 4 (four) hours as needed for wheezing or shortness of breath.   1 Inhaler   3   . clindamycin (CLEOCIN) 75 MG/5ML solution   Oral   Take 20 mLs (300 mg total)  by mouth 3 (three) times daily. For the next 10 days   600 mL   0   . HYDROcodone-acetaminophen (HYCET) 7.5-325 mg/15 ml solution   Oral   Take 15 mLs by mouth 4 (four) times daily as needed for moderate pain.   180 mL   0    BP 148/89  Pulse 126  Temp(Src) 99.7 F (37.6 C) (Oral)  Resp 12  SpO2 99%  LMP 03/21/2013 Physical Exam  Nursing note and vitals reviewed. Constitutional: She is oriented to person, place, and time. She appears well-developed and well-nourished. She is cooperative.  Non-toxic appearance. She appears ill. No distress.  HENT:  Head: Normocephalic and atraumatic.  Right Ear: Tympanic membrane and ear canal normal. Tympanic membrane is not injected and not erythematous.  Left Ear: Tympanic membrane and ear canal normal. Tympanic membrane is not injected and not erythematous.  Nose: Right sinus exhibits frontal sinus tenderness. Right sinus exhibits no maxillary sinus tenderness. Left sinus exhibits frontal sinus tenderness. Left sinus exhibits no maxillary sinus tenderness.  Mouth/Throat: Uvula is midline. No trismus in the jaw. No uvula swelling.  Bilateral erythematous, edematous tonsils with white exudate.    Neck: Normal range of motion. Neck supple.  Cardiovascular: Regular rhythm.  Tachycardia present.  Exam reveals no gallop and no friction rub.   No murmur heard. Pulmonary/Chest: Effort normal and breath sounds normal. No respiratory distress. She has  no wheezes. She has no rales.  Musculoskeletal: Normal range of motion. She exhibits no edema.  Lymphadenopathy:    She has cervical adenopathy (Bilateral 3+, tender cervical anterior LAD).  Neurological: She is alert and oriented to person, place, and time.  Skin: Skin is warm and dry. She is not diaphoretic.  Psychiatric: She has a normal mood and affect. Her behavior is normal.    ED Course  Procedures (including critical care time) Labs Review Labs Reviewed  POCT I-STAT, CHEM 8 - Abnormal;  Notable for the following:    BUN 4 (*)    All other components within normal limits  RAPID STREP SCREEN  CULTURE, GROUP A STREP   Imaging Review Ct Soft Tissue Neck W Contrast  04/06/2013   CLINICAL DATA:  Sore throat and ear ache since yesterday.  Fever.  EXAM: CT NECK WITH CONTRAST  TECHNIQUE: Multidetector CT imaging of the neck was performed using the standard protocol following the bolus administration of intravenous contrast.  CONTRAST:  75mL OMNIPAQUE IOHEXOL 300 MG/ML IV.  COMPARISON:  None.  FINDINGS: Enlarged sub lingual tonsils with a small (approximate 0.8 x 1.3 x 1.4 cm) fluid collection in the left tonsil. Mild narrowing of the pharyngeal airway as a result of the tonsillar enlargement. Normal and symmetric parotid and submandibular salivary glands.  Scattered upper normal sized lymph nodes in both sides of the neck, the largest in the jugulodigastric region bilaterally, on the left measuring approximately 1.6 x 2.2 cm and on the right measuring approximately 1.2 x 2.2 cm. No nodal masses. Normal sized lymph nodes in the posterior triangles, supraclavicular regions, and visualized superior mediastinum.  Normal larynx. Normal thyroid gland. Visualized lung apices clear. Bone window images demonstrate degenerative disc disease and spondylosis at C6-7.  IMPRESSION: 1. Tonsillitis with a very small tonsillar abscess involving the left sublingual tonsillar tissue. 2. Reactive bilateral cervical lymphadenopathy.  No nodal masses.   Electronically Signed   By: Hulan Saas M.D.   On: 04/06/2013 17:31    EKG Interpretation   None       MDM   1. Suppurative tonsillitis   2. Tonsillar abscess    Patient febrile and tachycardic on admission. Strep test negative. Patient Centor score for Strep Pharyngitis is 4/5. IV clindamycin started in ED. Fever resolved with Tylenol in ED. Tachycardia improved some with IV fluids, but continues to persist.  CT of neck and soft tissue shows small  LEFT sublingual tonsillar abscess. Patient discussed with Dr. Samuel Jester. Plan to consult ENT.    Discussed labs, and exam findings with patient. ENT doctor on call agrees to see patient on outpatient basis. Advised patient Call ENT doctor on Monday to schedule follow up appointment within 3 - 4 days.  Return to the ED sooner if sxs worsen.  Patient discharged home with antibiotic tx and pain medication. Advised patient to avoid additional tylenol containing medication with Pain medication and recommend OTC Ibuprofen for fever as directed on bottle. Patient discharged home.    Rudene Anda, PA-C 04/07/13 0020

## 2013-04-06 NOTE — ED Provider Notes (Signed)
Medical screening examination/treatment/procedure(s) were conducted as a shared visit with non-physician practitioner(s) and myself.  I personally evaluated the patient during the encounter.  37yo F, sore throat since yesterday. +febrile, tachycardic. Post pharyngeal erythema and tonsillar exudates. Hoarse voice. No intra-oral edema or lesions CT scan obtained. IV clindamycin and decadron given. 1745:  T/C to ENT Dr. Pollyann Kennedy, case discussed, including:  HPI, pertinent PM/SHx, VS/PE, dx testing, ED course and treatment:  requests to tx with abx, pain meds, f/u in ofc this week. Dx and testing, as well as d/w ENT MD, d/w pt and family.  Questions answered.  Verb understanding, agreeable to d/c home with outpt f/u.      Laray Anger, DO 04/10/13 1339

## 2013-04-09 ENCOUNTER — Telehealth (HOSPITAL_COMMUNITY): Payer: Self-pay | Admitting: Emergency Medicine

## 2013-04-09 NOTE — ED Notes (Signed)
Post ED Visit - Positive Culture Follow-up  Culture report reviewed by antimicrobial stewardship pharmacist: []  Wes Dulaney, Pharm.D., BCPS []  Celedonio Miyamoto, Pharm.D., BCPS []  Georgina Pillion, [address].D., BCPS []  [place], [address].D., BCPS, AAHIVP []  Estella Husk, Pharm.D., BCPS, AAHIVP [x]  Harland German, Pharm.D., BCPS  Positive strep culture Treated with Keflex & Clindamycin, organism sensitive to the same and no further patient follow-up is required at this time.  Marcelle Overlie, Jenel Lucks 04/09/2013, 11:48 AM

## 2013-04-10 NOTE — ED Provider Notes (Signed)
Medical screening examination/treatment/procedure(s) were conducted as a shared visit with non-physician practitioner(s) and myself.  I personally evaluated the patient during the encounter. Please see my previous note.     Laray Anger, DO 04/10/13 1339

## 2013-04-14 ENCOUNTER — Encounter (HOSPITAL_COMMUNITY): Payer: Self-pay | Admitting: Emergency Medicine

## 2013-04-14 ENCOUNTER — Emergency Department (HOSPITAL_COMMUNITY)
Admission: EM | Admit: 2013-04-14 | Discharge: 2013-04-14 | Disposition: A | Discharge status: Home / Self Care | Payer: Medicaid Other | Attending: Emergency Medicine | Admitting: Emergency Medicine

## 2013-04-14 DIAGNOSIS — J36 Peritonsillar abscess: Secondary | ICD-10-CM

## 2013-04-14 DIAGNOSIS — M129 Arthropathy, unspecified: Secondary | ICD-10-CM | POA: Insufficient documentation

## 2013-04-14 DIAGNOSIS — Z79899 Other long term (current) drug therapy: Secondary | ICD-10-CM | POA: Insufficient documentation

## 2013-04-14 DIAGNOSIS — R1310 Dysphagia, unspecified: Secondary | ICD-10-CM | POA: Insufficient documentation

## 2013-04-14 DIAGNOSIS — R599 Enlarged lymph nodes, unspecified: Secondary | ICD-10-CM | POA: Insufficient documentation

## 2013-04-14 LAB — POCT I-STAT, CHEM 8
BUN: 9 mg/dL (ref 6–23)
CALCIUM ION: 1.11 mmol/L — AB (ref 1.12–1.23)
CREATININE: 1 mg/dL (ref 0.50–1.10)
Chloride: 99 mEq/L (ref 96–112)
GLUCOSE: 85 mg/dL (ref 70–99)
HCT: 38 % (ref 36.0–46.0)
HEMOGLOBIN: 12.9 g/dL (ref 12.0–15.0)
Potassium: 4 mEq/L (ref 3.7–5.3)
Sodium: 137 mEq/L (ref 137–147)
TCO2: 27 mmol/L (ref 0–100)

## 2013-04-14 LAB — CBC WITH DIFFERENTIAL/PLATELET
BASOS PCT: 0 % (ref 0–1)
Basophils Absolute: 0 10*3/uL (ref 0.0–0.1)
EOS ABS: 0.7 10*3/uL (ref 0.0–0.7)
EOS PCT: 3 % (ref 0–5)
HCT: 33.2 % — ABNORMAL LOW (ref 36.0–46.0)
HEMOGLOBIN: 10.6 g/dL — AB (ref 12.0–15.0)
LYMPHS ABS: 4.2 10*3/uL — AB (ref 0.7–4.0)
Lymphocytes Relative: 18 % (ref 12–46)
MCH: 26.6 pg (ref 26.0–34.0)
MCHC: 31.9 g/dL (ref 30.0–36.0)
MCV: 83.2 fL (ref 78.0–100.0)
MONO ABS: 1.4 10*3/uL — AB (ref 0.1–1.0)
Monocytes Relative: 6 % (ref 3–12)
NEUTROS ABS: 17.3 10*3/uL — AB (ref 1.7–7.7)
Neutrophils Relative %: 73 % (ref 43–77)
Platelets: 520 10*3/uL — ABNORMAL HIGH (ref 150–400)
RBC: 3.99 MIL/uL (ref 3.87–5.11)
RDW: 15.1 % (ref 11.5–15.5)
WBC: 23.6 10*3/uL — ABNORMAL HIGH (ref 4.0–10.5)

## 2013-04-14 MED ORDER — CLINDAMYCIN PHOSPHATE 600 MG/50ML IV SOLN
600.0000 mg | Freq: Once | INTRAVENOUS | Status: AC
  Administered 2013-04-14: 600 mg via INTRAVENOUS
  Filled 2013-04-14: qty 50

## 2013-04-14 MED ORDER — SODIUM CHLORIDE 0.9 % IV BOLUS (SEPSIS)
1000.0000 mL | Freq: Once | INTRAVENOUS | Status: AC
  Administered 2013-04-14: 1000 mL via INTRAVENOUS

## 2013-04-14 MED ORDER — DEXAMETHASONE SODIUM PHOSPHATE 10 MG/ML IJ SOLN
10.0000 mg | Freq: Once | INTRAMUSCULAR | Status: AC
  Administered 2013-04-14: 10 mg via INTRAVENOUS
  Filled 2013-04-14: qty 1

## 2013-04-14 MED ORDER — HYDROCODONE-ACETAMINOPHEN 7.5-325 MG/15ML PO SOLN: 15.0000 mL | Freq: Four times a day (QID) | ORAL | Status: DC | PRN

## 2013-04-14 MED ORDER — HYDROMORPHONE HCL PF 1 MG/ML IJ SOLN
0.5000 mg | Freq: Once | INTRAMUSCULAR | Status: AC
  Administered 2013-04-14: 0.5 mg via INTRAVENOUS
  Filled 2013-04-14: qty 1

## 2013-04-14 NOTE — ED Provider Notes (Signed)
CSN: [id number]     Arrival date & time 04/14/13  1108 History   First MD Initiated Contact with Patient 04/14/13 1138     Chief Complaint  Patient presents with  . Sore Throat   (Consider location/radiation/quality/duration/timing/severity/associated sxs/prior Treatment) HPI  38 year old female presents complaining of sore throat. Patient reports a week ago she was seen in the ED for evaluations of sore throat. At that time her rapid strep test was negative however cultures shows group A strep. She was initially started on clindamycin with high sig as pain medication. 6 days ago she was seen by an ENT, Dr. Jenne Pane, who also added prednisone to her regimen. She has finished taking the prednisone, pain medication, and currently continue with antibiotic however she felt that her pain is getting progressively worse. The pain initially was affecting her left side of neck but now more so on the right side. She is having trouble eating and drinking due to pain. She does not know this increase swelling. She is able to tolerates her saliva. She denies fever, runny nose, chest pain, shortness of breath, or rash. She denies any abdominal pain. She is here primarily for pain control.  Past Medical History  Diagnosis Date  . Arthritis   . Bronchitis    Past Surgical History  Procedure Laterality Date  . Tubular ligation    . Cholecystectomy     History reviewed. No pertinent family history. History  Substance Use Topics  . Smoking status: Never Smoker   . Smokeless tobacco: Not on file  . Alcohol Use: No   OB History   Grav Para Term Preterm Abortions TAB SAB Ect Mult Living                 Review of Systems  Constitutional: Negative for fever.  HENT: Positive for sore throat and trouble swallowing. Negative for drooling and facial swelling.   Skin: Negative for rash and wound.  Neurological: Negative for headaches.  All other systems reviewed and are negative.    Allergies  Shellfish  allergy and Asa  Home Medications   Current Outpatient Rx  Name  Route  Sig  Dispense  Refill  . albuterol (PROVENTIL HFA;VENTOLIN HFA) 108 (90 BASE) MCG/ACT inhaler   Inhalation   Inhale 2 puffs into the lungs every 4 (four) hours as needed for wheezing or shortness of breath.   1 Inhaler   3   . clindamycin (CLEOCIN) 75 MG/5ML solution   Oral   Take 20 mLs (300 mg total) by mouth 3 (three) times daily. For the next 10 days   600 mL   0   . HYDROcodone-acetaminophen (HYCET) 7.5-325 mg/15 ml solution   Oral   Take 15 mLs by mouth 4 (four) times daily as needed for moderate pain.   180 mL   0    BP 146/112  Pulse 109  Temp(Src) 98.5 F (36.9 C) (Oral)  Resp 18  Wt 264 lb 8 oz (119.976 kg)  SpO2 100%  LMP 03/21/2013 Physical Exam  Nursing note and vitals reviewed. Constitutional: She appears well-developed and well-nourished. No distress.  HENT:  Head: Atraumatic.  Ear: TM normal bilaterally  Nose: normal  Throat: has trismus, Uvula deviate to L, tonsillar pillar enlargement to suggest PTA, no tongue edema.    Eyes: Conjunctivae are normal.  Neck: Neck supple. No tracheal deviation present. No thyromegaly present.  Abdominal: There is no tenderness.  Lymphadenopathy:    She has cervical adenopathy.  Neurological:  She is alert.  Skin: No rash noted.  Psychiatric: She has a normal mood and affect.    ED Course  Procedures (including critical care time)  12:00 PM Pt here with worsening sore throat.  Initial rapid strep screen negative on 04/06/2013, but throat culture indicated Group A strep.  Now has trismus, has evidence of R peritonsillar abscess, difficulty swallowing not improving with clindamycin.  Dr. Radford PaxBeaton has evaluated pt and agrees, will consult ENT for further care.    12:24 PM Dr. Annalee GentaShoemaker was consulted and agrees to see pt in ER.  Request clinda IV, NS, Decadron, which we will ordered. Pt aware of plan.    1:38 PM Dr. Annalee GentaShoemaker has evaluate pt  and has I&D Peritonsillar abscess.  He recommend pt f/u in his office in the next few days for recheck.  Pt to continue abx, pain meds and gargle with salt water.  Return precaution discussed.  Pt agrees with plan.     Labs Review Labs Reviewed  CBC WITH DIFFERENTIAL - Abnormal; Notable for the following:    WBC 23.6 (*)    Hemoglobin 10.6 (*)    HCT 33.2 (*)    Platelets 520 (*)    Neutro Abs 17.3 (*)    Lymphs Abs 4.2 (*)    Monocytes Absolute 1.4 (*)    All other components within normal limits  POCT I-STAT, CHEM 8 - Abnormal; Notable for the following:    Calcium, Ion 1.11 (*)    All other components within normal limits   Imaging Review No results found.  EKG Interpretation   None       MDM   1. Abscess, peritonsillar    BP 143/85  Pulse 92  Temp(Src) 98.5 F (36.9 C) (Oral)  Resp 18  Wt 264 lb 8 oz (119.976 kg)  SpO2 100%  LMP 03/21/2013  I have reviewed nursing notes and vital signs.  I reviewed available ER/hospitalization records thought the EMR     Fayrene HelperBowie Shanequia Kendrick, [place]-C 04/14/13 1339

## 2013-04-14 NOTE — ED Notes (Signed)
Pt was here 12/27 for sore throat, had tonsilitis and small abscess. Followed up with ent and took meds as prescribed but now having pain and swelling to right side of throat x 3 days. Is able to swallow, airway intact at triage.

## 2013-04-14 NOTE — ED Provider Notes (Signed)
Medical screening examination/treatment/procedure(s) were conducted as a shared visit with non-physician practitioner(s) and myself.  I personally evaluated the patient during the encounter    Nelia Shiobert L Takiyah Bohnsack, MD 04/14/13 1406

## 2013-04-14 NOTE — Discharge Instructions (Signed)
Please call ENT office tomorrow to schedule follow up appointment.  Continue with your antibiotic and pain medication.  Rest, hydrates, gargle with salt water.  Return if your symptoms worsen or if you have other concerns.  Peritonsillar Abscess A peritonsillar abscess is a collection of pus located in the back of the throat behind the tonsils. It usually occurs when a streptococcal infection of the throat or tonsils spreads into the space around the tonsils. They are almost always caused by the streptococcal germ (bacteria). The treatment of a peritonsillar abscess is most often drainage accomplished by putting a needle into the abscess or cutting (incising) and draining the abscess. This is most often followed with a course of antibiotics. HOME CARE INSTRUCTIONS  If your abscess was drained by your caregiver today, rinse your throat (gargle) with warm salt water four times per day or as needed for comfort. Do not swallow this mixture. Mix 1 teaspoon of salt in 8 ounces of warm water for gargling.  Rest in bed as needed. Resume activities as able.  Apply cold to your neck for pain relief. Fill a plastic bag with ice and wrap it in a towel. Hold the ice on your neck for 20 minutes 4 times per day.  Eat a soft or liquid diet as tolerated while your throat remains sore. Popsicles and ice cream may be good early choices. Drinking plenty of cold fluids will probably be soothing and help take swelling down in between the warm gargles.  Only take over-the-counter or prescription medicines for pain, discomfort, or fever as directed by your caregiver. Do not use aspirin unless directed by your physician. Aspirin slows down the clotting process. It can also cause bleeding from the drainage area if this was needled or incised today.  If antibiotics were prescribed, take them as directed for the full course of the prescription. Even if you feel you are well, you need to take them. SEEK MEDICAL CARE IF:   You  have increased pain, swelling, redness, or drainage in your throat.  You develop signs of infection such as dizziness, headache, lethargy, or generalized feelings of illness.  You have difficulty breathing, swallowing or eating.  You show signs of becoming dehydrated (lightheadedness when standing, decreased urine output, a fast heart rate, or dry mouth and mucous membranes). SEEK IMMEDIATE MEDICAL CARE IF:   You have a fever.  You are coughing up or vomiting blood.  You develop more severe throat pain uncontrolled with medicines or you start to drool.  You develop difficulty breathing, talking, or find it easier to breathe while leaning forward. Document Released: 03/28/2005 Document Revised: 06/20/2011 Document Reviewed: 11/09/2007 [facility] [place] [facility] Patient Information 2014 [employer], [place].

## 2013-04-14 NOTE — Consult Note (Signed)
ENT CONSULT:  Reason for Consult: Progressive ST Referring Physician: EDP  Carla Barton is an 38 y.o. female.  HPI: 10d hx of prog ST, pt tx'ed with clinda, increasing ST and Rt otalgia over 3 days. Poor po  Past Medical History  Diagnosis Date  . Arthritis   . Bronchitis     Past Surgical History  Procedure Laterality Date  . Tubular ligation    . Cholecystectomy      History reviewed. No pertinent family history.  Social History:  reports that she has never smoked. She does not have any smokeless tobacco history on file. She reports that she does not drink alcohol or use illicit drugs.  Allergies:  Allergies  Allergen Reactions  . Shellfish Allergy Anaphylaxis and Itching  . Asa [Aspirin] Other (See Comments)    "asthma to flare"    Medications: I have reviewed the patient's current medications.  Results for orders placed during the hospital encounter of 04/14/13 (from the past 48 hour(s))  CBC WITH DIFFERENTIAL     Status: Abnormal (Preliminary result)   Collection Time    04/14/13 11:55 AM      Result Value Range   WBC 23.6 (*) 4.0 - 10.5 K/uL   RBC 3.99  3.87 - 5.11 MIL/uL   Hemoglobin 10.6 (*) 12.0 - 15.0 g/dL   HCT 16.1 (*) 09.6 - 04.5 %   MCV 83.2  78.0 - 100.0 fL   MCH 26.6  26.0 - 34.0 pg   MCHC 31.9  30.0 - 36.0 g/dL   RDW 40.9  81.1 - 91.4 %   Platelets 520 (*) 150 - 400 K/uL   Neutrophils Relative % PENDING  43 - 77 %   Neutro Abs PENDING  1.7 - 7.7 K/uL   Band Neutrophils PENDING  0 - 10 %   Lymphocytes Relative PENDING  12 - 46 %   Lymphs Abs PENDING  0.7 - 4.0 K/uL   Monocytes Relative PENDING  3 - 12 %   Monocytes Absolute PENDING  0.1 - 1.0 K/uL   Eosinophils Relative PENDING  0 - 5 %   Eosinophils Absolute PENDING  0.0 - 0.7 K/uL   Basophils Relative PENDING  0 - 1 %   Basophils Absolute PENDING  0.0 - 0.1 K/uL   WBC Morphology PENDING     RBC Morphology PENDING     Smear Review PENDING     nRBC PENDING  0 /100 WBC   Metamyelocytes  Relative PENDING     Myelocytes PENDING     Promyelocytes Absolute PENDING     Blasts PENDING    POCT I-STAT, CHEM 8     Status: Abnormal   Collection Time    04/14/13 12:32 PM      Result Value Range   Sodium 137  137 - 147 mEq/L   Potassium 4.0  3.7 - 5.3 mEq/L   Chloride 99  96 - 112 mEq/L   BUN 9  6 - 23 mg/dL   Creatinine, Ser 7.82  0.50 - 1.10 mg/dL   Glucose, Bld 85  70 - 99 mg/dL   Calcium, Ion 9.56 (*) 1.12 - 1.23 mmol/L   TCO2 27  0 - 100 mmol/L   Hemoglobin 12.9  12.0 - 15.0 g/dL   HCT 21.3  08.6 - 57.8 %    No results found.  ROS:ROS  Blood pressure 143/85, pulse 92, temperature 98.5 F (36.9 C), temperature source Oral, resp. rate 18, weight 119.976 kg (  264 lb 8 oz), last menstrual period 03/21/2013, SpO2 100.00%.  PHYSICAL EXAM: General appearance - alert, well appearing, and in no distress Mouth - mucous membranes moist, pharynx normal without lesions and 3+ tonsils with erythema, Rt PTA Neck - Rt LA  Procedure: I&D Rt PTA Consent obtained Local Anesth 5cc purulent d/c Min bleeding, no complications  Studies Reviewed:none  Assessment/Plan: Complete IVF, IV cleocin and decadron Cont po Abx and increase po fluids F/U 7 days for recheck  Carla Barton 04/14/2013, 1:31 PM

## 2013-04-14 NOTE — ED Notes (Signed)
Carla Barton, ENT has finished draining abscess. Procedure tolerated well. VSS post procedure.

## 2013-04-14 NOTE — ED Notes (Signed)
ENT, MD at bedside. ENT cart and other supplies he requested provided at bedside.

## 2013-04-19 ENCOUNTER — Encounter (HOSPITAL_COMMUNITY)
Admission: EM | Disposition: A | Discharge status: Home / Self Care | Payer: Self-pay | Source: Home / Self Care | Attending: Otolaryngology

## 2013-04-19 ENCOUNTER — Encounter (HOSPITAL_COMMUNITY): Payer: Self-pay | Admitting: Emergency Medicine

## 2013-04-19 ENCOUNTER — Encounter (HOSPITAL_COMMUNITY): Payer: Medicaid Other | Admitting: Anesthesiology

## 2013-04-19 ENCOUNTER — Emergency Department (HOSPITAL_COMMUNITY): Payer: Medicaid Other | Admitting: Anesthesiology

## 2013-04-19 ENCOUNTER — Inpatient Hospital Stay (HOSPITAL_COMMUNITY)
Admission: EM | Admit: 2013-04-19 | Discharge: 2013-04-23 | DRG: 134 | Disposition: A | Discharge status: Home / Self Care | Payer: Medicaid Other | Attending: Otolaryngology | Admitting: Otolaryngology

## 2013-04-19 DIAGNOSIS — M129 Arthropathy, unspecified: Secondary | ICD-10-CM | POA: Diagnosis present

## 2013-04-19 DIAGNOSIS — J36 Peritonsillar abscess: Principal | ICD-10-CM | POA: Diagnosis present

## 2013-04-19 HISTORY — PX: TONSILLECTOMY: SHX5217

## 2013-04-19 LAB — CBC WITH DIFFERENTIAL/PLATELET
Basophils Absolute: 0 10*3/uL (ref 0.0–0.1)
Basophils Relative: 0 % (ref 0–1)
EOS ABS: 0.3 10*3/uL (ref 0.0–0.7)
EOS PCT: 1 % (ref 0–5)
HCT: 32.9 % — ABNORMAL LOW (ref 36.0–46.0)
Hemoglobin: 10.6 g/dL — ABNORMAL LOW (ref 12.0–15.0)
Lymphocytes Relative: 7 % — ABNORMAL LOW (ref 12–46)
Lymphs Abs: 2 10*3/uL (ref 0.7–4.0)
MCH: 26.6 pg (ref 26.0–34.0)
MCHC: 32.2 g/dL (ref 30.0–36.0)
MCV: 82.7 fL (ref 78.0–100.0)
Monocytes Absolute: 1.7 10*3/uL — ABNORMAL HIGH (ref 0.1–1.0)
Monocytes Relative: 6 % (ref 3–12)
NEUTROS PCT: 86 % — AB (ref 43–77)
Neutro Abs: 24.6 10*3/uL — ABNORMAL HIGH (ref 1.7–7.7)
PLATELETS: 403 10*3/uL — AB (ref 150–400)
RBC: 3.98 MIL/uL (ref 3.87–5.11)
RDW: 15.3 % (ref 11.5–15.5)
WBC: 28.6 10*3/uL — ABNORMAL HIGH (ref 4.0–10.5)

## 2013-04-19 LAB — BASIC METABOLIC PANEL
BUN: 6 mg/dL (ref 6–23)
CO2: 25 meq/L (ref 19–32)
CREATININE: 0.82 mg/dL (ref 0.50–1.10)
Calcium: 8.6 mg/dL (ref 8.4–10.5)
Chloride: 98 mEq/L (ref 96–112)
GFR calc Af Amer: >90 mL/min (ref >90)
GFR calc non Af Amer: >90 mL/min (ref >90)
GLUCOSE: 98 mg/dL (ref 70–99)
Potassium: 3.8 mEq/L (ref 3.7–5.3)
SODIUM: 136 meq/L — AB (ref 137–147)

## 2013-04-19 SURGERY — TONSILLECTOMY
Anesthesia: General | Laterality: Bilateral

## 2013-04-19 MED ORDER — CLINDAMYCIN PHOSPHATE 600 MG/50ML IV SOLN
600.0000 mg | Freq: Once | INTRAVENOUS | Status: AC
  Administered 2013-04-19: 600 mg via INTRAVENOUS
  Filled 2013-04-19: qty 50

## 2013-04-19 MED ORDER — LACTATED RINGERS IV SOLN
INTRAVENOUS | Status: DC
  Administered 2013-04-19 (×3): via INTRAVENOUS

## 2013-04-19 MED ORDER — MIDAZOLAM HCL 5 MG/5ML IJ SOLN
INTRAMUSCULAR | Status: DC | PRN
  Administered 2013-04-19: 2 mg via INTRAVENOUS

## 2013-04-19 MED ORDER — DEXAMETHASONE SODIUM PHOSPHATE 10 MG/ML IJ SOLN
INTRAMUSCULAR | Status: DC | PRN
  Administered 2013-04-19: 10 mg via INTRAVENOUS

## 2013-04-19 MED ORDER — SODIUM CHLORIDE 0.9 % IV BOLUS (SEPSIS)
1000.0000 mL | Freq: Once | INTRAVENOUS | Status: AC
  Administered 2013-04-19: 1000 mL via INTRAVENOUS

## 2013-04-19 MED ORDER — DEXAMETHASONE SODIUM PHOSPHATE 10 MG/ML IJ SOLN
10.0000 mg | Freq: Once | INTRAMUSCULAR | Status: AC
  Administered 2013-04-19: 10 mg via INTRAVENOUS
  Filled 2013-04-19: qty 1

## 2013-04-19 MED ORDER — HYDROMORPHONE HCL PF 1 MG/ML IJ SOLN
INTRAMUSCULAR | Status: AC
  Filled 2013-04-19: qty 1

## 2013-04-19 MED ORDER — PROMETHAZINE HCL 25 MG RE SUPP: 12.5000 mg | Freq: Four times a day (QID) | RECTAL | Status: DC | PRN

## 2013-04-19 MED ORDER — VECURONIUM BROMIDE 10 MG IV SOLR
INTRAVENOUS | Status: DC | PRN
  Administered 2013-04-19: 2 mg via INTRAVENOUS

## 2013-04-19 MED ORDER — HYDROMORPHONE HCL PF 1 MG/ML IJ SOLN
0.2500 mg | INTRAMUSCULAR | Status: DC | PRN
  Administered 2013-04-19: 0.5 mg via INTRAVENOUS
  Administered 2013-04-19: 0.25 mg via INTRAVENOUS

## 2013-04-19 MED ORDER — LIDOCAINE HCL (CARDIAC) 20 MG/ML IV SOLN
INTRAVENOUS | Status: DC | PRN
  Administered 2013-04-19: 40 mg via INTRAVENOUS
  Administered 2013-04-19: 60 mg via INTRAVENOUS

## 2013-04-19 MED ORDER — KCL IN DEXTROSE-NACL 20-5-0.45 MEQ/L-%-% IV SOLN
INTRAVENOUS | Status: DC
  Administered 2013-04-19 – 2013-04-23 (×8): via INTRAVENOUS
  Filled 2013-04-19 (×11): qty 1000

## 2013-04-19 MED ORDER — MORPHINE SULFATE 2 MG/ML IJ SOLN
2.0000 mg | INTRAMUSCULAR | Status: DC | PRN
  Administered 2013-04-19: 4 mg via INTRAVENOUS
  Administered 2013-04-19 – 2013-04-20 (×2): 2 mg via INTRAVENOUS
  Administered 2013-04-20 – 2013-04-23 (×14): 4 mg via INTRAVENOUS
  Filled 2013-04-19: qty 2
  Filled 2013-04-19: qty 1
  Filled 2013-04-19 (×9): qty 2
  Filled 2013-04-19: qty 1
  Filled 2013-04-19 (×5): qty 2

## 2013-04-19 MED ORDER — FENTANYL CITRATE 0.05 MG/ML IJ SOLN
INTRAMUSCULAR | Status: DC | PRN
  Administered 2013-04-19: 50 ug via INTRAVENOUS
  Administered 2013-04-19: 100 ug via INTRAVENOUS

## 2013-04-19 MED ORDER — HYDROCODONE-ACETAMINOPHEN 7.5-325 MG/15ML PO SOLN
15.0000 mL | ORAL | Status: DC | PRN
  Administered 2013-04-20 – 2013-04-23 (×5): 15 mL via ORAL
  Filled 2013-04-19 (×7): qty 15

## 2013-04-19 MED ORDER — ARTIFICIAL TEARS OP OINT
TOPICAL_OINTMENT | OPHTHALMIC | Status: DC | PRN
  Administered 2013-04-19: 1 via OPHTHALMIC

## 2013-04-19 MED ORDER — PROPOFOL 10 MG/ML IV BOLUS
INTRAVENOUS | Status: DC | PRN
  Administered 2013-04-19: 150 mg via INTRAVENOUS

## 2013-04-19 MED ORDER — 0.9 % SODIUM CHLORIDE (POUR BTL) OPTIME
TOPICAL | Status: DC | PRN
  Administered 2013-04-19: 1000 mL

## 2013-04-19 MED ORDER — HYDROMORPHONE HCL PF 1 MG/ML IJ SOLN
1.0000 mg | Freq: Once | INTRAMUSCULAR | Status: AC
  Administered 2013-04-19: 1 mg via INTRAVENOUS
  Filled 2013-04-19: qty 1

## 2013-04-19 MED ORDER — GLYCOPYRROLATE 0.2 MG/ML IJ SOLN
INTRAMUSCULAR | Status: DC | PRN
  Administered 2013-04-19: 0.4 mg via INTRAVENOUS

## 2013-04-19 MED ORDER — CLINDAMYCIN PHOSPHATE 600 MG/50ML IV SOLN
600.0000 mg | Freq: Three times a day (TID) | INTRAVENOUS | Status: DC
  Administered 2013-04-19 – 2013-04-23 (×11): 600 mg via INTRAVENOUS
  Filled 2013-04-19 (×13): qty 50

## 2013-04-19 MED ORDER — ONDANSETRON HCL 4 MG/2ML IJ SOLN
INTRAMUSCULAR | Status: DC | PRN
  Administered 2013-04-19: 4 mg via INTRAVENOUS

## 2013-04-19 MED ORDER — NEOSTIGMINE METHYLSULFATE 1 MG/ML IJ SOLN
INTRAMUSCULAR | Status: DC | PRN
  Administered 2013-04-19: 2 mg via INTRAVENOUS

## 2013-04-19 MED ORDER — OXYCODONE HCL 5 MG PO TABS: 5.0000 mg | ORAL_TABLET | Freq: Once | ORAL | Status: DC | PRN

## 2013-04-19 MED ORDER — OXYCODONE HCL 5 MG/5ML PO SOLN: 5.0000 mg | Freq: Once | ORAL | Status: DC | PRN

## 2013-04-19 MED ORDER — SUCCINYLCHOLINE CHLORIDE 20 MG/ML IJ SOLN
INTRAMUSCULAR | Status: DC | PRN
  Administered 2013-04-19: 100 mg via INTRAVENOUS

## 2013-04-19 MED ORDER — PROMETHAZINE HCL 25 MG PO TABS: 12.5000 mg | ORAL_TABLET | Freq: Four times a day (QID) | ORAL | Status: DC | PRN

## 2013-04-19 SURGICAL SUPPLY — 34 items
CANISTER SUCTION 2500CC (MISCELLANEOUS) ×3 IMPLANT
CATH ROBINSON RED A/P 10FR (CATHETERS) IMPLANT
CLEANER TIP ELECTROSURG 2X2 (MISCELLANEOUS) ×3 IMPLANT
CLOTH BEACON ORANGE TIMEOUT ST (SAFETY) IMPLANT
COAGULATOR SUCT 6 FR SWTCH (ELECTROSURGICAL) ×1
COAGULATOR SUCT SWTCH 10FR 6 (ELECTROSURGICAL) ×2 IMPLANT
CRADLE DONUT ADULT HEAD (MISCELLANEOUS) IMPLANT
ELECT COATED BLADE 2.86 ST (ELECTRODE) ×3 IMPLANT
ELECT REM PT RETURN 9FT ADLT (ELECTROSURGICAL) ×3
ELECT REM PT RETURN 9FT PED (ELECTROSURGICAL)
ELECTRODE REM PT RETRN 9FT PED (ELECTROSURGICAL) IMPLANT
ELECTRODE REM PT RTRN 9FT ADLT (ELECTROSURGICAL) ×1 IMPLANT
GAUZE SPONGE 4X4 16PLY XRAY LF (GAUZE/BANDAGES/DRESSINGS) ×3 IMPLANT
GLOVE BIO SURGEON STRL SZ7.5 (GLOVE) ×3 IMPLANT
GOWN STRL NON-REIN LRG LVL3 (GOWN DISPOSABLE) ×6 IMPLANT
KIT BASIN OR (CUSTOM PROCEDURE TRAY) ×3 IMPLANT
KIT ROOM TURNOVER OR (KITS) ×3 IMPLANT
NEEDLE 18GX1X1/2 (RX/OR ONLY) (NEEDLE) ×3 IMPLANT
NS IRRIG 1000ML POUR BTL (IV SOLUTION) ×3 IMPLANT
PACK SURGICAL SETUP 50X90 (CUSTOM PROCEDURE TRAY) ×3 IMPLANT
PAD ARMBOARD 7.5X6 YLW CONV (MISCELLANEOUS) ×3 IMPLANT
PENCIL BUTTON HOLSTER BLD 10FT (ELECTRODE) ×3 IMPLANT
SPECIMEN JAR SMALL (MISCELLANEOUS) ×6 IMPLANT
SPONGE TONSIL 1.25 RF SGL STRG (GAUZE/BANDAGES/DRESSINGS) ×3 IMPLANT
SYR BULB 3OZ (MISCELLANEOUS) ×3 IMPLANT
TOWEL OR 17X24 6PK STRL BLUE (TOWEL DISPOSABLE) ×6 IMPLANT
TUBE CONNECTING 12'X1/4 (SUCTIONS) ×1
TUBE CONNECTING 12X1/4 (SUCTIONS) ×2 IMPLANT
TUBE SALEM SUMP 10F W/ARV (TUBING) IMPLANT
TUBE SALEM SUMP 12R W/ARV (TUBING) IMPLANT
TUBE SALEM SUMP 14F W/ARV (TUBING) ×3 IMPLANT
TUBE SALEM SUMP 16 FR W/ARV (TUBING) IMPLANT
WATER STERILE IRR 1000ML POUR (IV SOLUTION) IMPLANT
YANKAUER SUCT BULB TIP NO VENT (SUCTIONS) ×3 IMPLANT

## 2013-04-19 NOTE — ED Provider Notes (Signed)
CSN: [id number]     Arrival date & time 04/19/13  0846 History   First MD Initiated Contact with Patient 04/19/13 0848     Chief Complaint  Patient presents with  . Oral Swelling   (Consider location/radiation/quality/duration/timing/severity/associated sxs/prior Treatment) HPI Comments: Patient presents today with a sore throat that has been present for the past week.  She reports increased pain and swelling of her throat this morning.  She reports that she has had difficulty swallowing this morning and has been vomiting up mucous.  She also reports that she is unable to fully open her mouth.  She was seen by ENT on 04/14/13 for the same and had a peritonsillar abscess drained at that time while in the ED.  She then followed up with ENT on 04/17/13 and had the peritonsillar abscess drained again at that time.  She reports that she is currently taking Hydrocodone for the pain and Clindamycin, which does not seem to be helping.  She reports that she has had a low grade fever.  She denies any stiffness of her neck.  She denies any nausea or abdominal pain.    The history is provided by the patient.    Past Medical History  Diagnosis Date  . Arthritis   . Bronchitis    Past Surgical History  Procedure Laterality Date  . Tubular ligation    . Cholecystectomy     History reviewed. No pertinent family history. History  Substance Use Topics  . Smoking status: Never Smoker   . Smokeless tobacco: Not on file  . Alcohol Use: No   OB History   Grav Para Term Preterm Abortions TAB SAB Ect Mult Living                 Review of Systems  All other systems reviewed and are negative.    Allergies  Shellfish allergy and Asa  Home Medications   Current Outpatient Rx  Name  Route  Sig  Dispense  Refill  . clindamycin (CLEOCIN) 75 MG/5ML solution   Oral   Take 20 mLs (300 mg total) by mouth 3 (three) times daily. For the next 10 days   600 mL   0   . HYDROcodone-acetaminophen (HYCET)  7.5-325 mg/15 ml solution   Oral   Take 15 mLs by mouth 4 (four) times daily as needed for moderate pain.   180 mL   0    BP 144/93  Pulse 97  Temp(Src) 99.3 F (37.4 C) (Oral)  Resp 22  SpO2 97%  LMP 03/21/2013 Physical Exam  Nursing note and vitals reviewed. Constitutional: She appears well-developed and well-nourished. No distress.  HENT:  Head: Normocephalic and atraumatic.  Mouth/Throat: There is trismus in the jaw. Posterior oropharyngeal edema and posterior oropharyngeal erythema present.  Patient drooling. Airway is patent.  Patient breathing without difficulty.  Neck: Normal range of motion. Neck supple.  Cardiovascular: Regular rhythm and normal heart sounds.  Tachycardia present.   Pulmonary/Chest: Effort normal and breath sounds normal.  Neurological: She is alert.  Skin: Skin is warm and dry. She is not diaphoretic.  Psychiatric: She has a normal mood and affect.    ED Course  Procedures (including critical care time) Labs Review Labs Reviewed - No data to display Imaging Review No results found.  EKG Interpretation   None      9:43 AM Discussed with Dr. Jenne PaneBates with ENT.  He reports that he will come see the patient in the ED.  12:22 PM Patient evaluated by Dr. Jenne Pane with ENT.  He states that he will take the patient to the OR to remove tonsils.    1:08 PM Reassessed patient.  She reports that her pain is controlled at this time.    MDM  No diagnosis found. Patient presents with increased swelling and pain of her throat.  Patient reports inability to swallow and appears uncomfortable on exam.  Patient with recurrent right PTA.  She has been seen by ENT two times in the past week and had the abscess incised and drained.  ENT consulted again today and Dr. Jenne Pane evaluated the patient in the ED.  Dr. Jenne Pane to take the patient to OR to remove tonsils and has agreed to admit the patient.     Santiago Glad, PA-C 04/19/13 1313

## 2013-04-19 NOTE — ED Notes (Signed)
MD at bedside. 

## 2013-04-19 NOTE — Anesthesia Procedure Notes (Signed)
Procedure Name: Intubation Date/Time: 04/19/2013 3:53 PM Performed by: Wray KearnsFOLEY, Keng Jewel A Pre-anesthesia Checklist: Patient identified, Timeout performed, Emergency Drugs available, Suction available and Patient being monitored Patient Re-evaluated:Patient Re-evaluated prior to inductionOxygen Delivery Method: Circle system utilized Preoxygenation: Pre-oxygenation with 100% oxygen Intubation Type: IV induction and Cricoid Pressure applied Ventilation: Mask ventilation without difficulty Laryngoscope Size: Mac Grade View: Grade I Tube type: Oral Tube size: 7.5 mm Number of attempts: 1 Airway Equipment and Method: Stylet and Video-laryngoscopy Placement Confirmation: ETT inserted through vocal cords under direct vision,  breath sounds checked- equal and bilateral and positive ETCO2 Secured at: 22 cm Tube secured with: Tape Dental Injury: Teeth and Oropharynx as per pre-operative assessment  Difficulty Due To: Difficulty was anticipated, Difficult Airway-  due to edematous airway and Difficult Airway- due to limited oral opening

## 2013-04-19 NOTE — Anesthesia Preprocedure Evaluation (Signed)
Anesthesia Evaluation  Patient identified by MRN, date of birth, ID band Patient awake    Reviewed: Allergy & Precautions, H&P , NPO status , Patient's Chart, lab work & pertinent test results  Airway Mallampati: IV TM Distance: >3 FB Neck ROM: Full  Mouth opening: Limited Mouth Opening  Dental no notable dental hx. (+) Teeth Intact and Dental Advisory Given   Pulmonary neg pulmonary ROS,  breath sounds clear to auscultation  Pulmonary exam normal       Cardiovascular negative cardio ROS  Rhythm:Regular Rate:Normal     Neuro/Psych negative neurological ROS  negative psych ROS   GI/Hepatic negative GI ROS, Neg liver ROS,   Endo/Other  Morbid obesity  Renal/GU negative Renal ROS  negative genitourinary   Musculoskeletal   Abdominal   Peds  Hematology negative hematology ROS (+)   Anesthesia Other Findings   Reproductive/Obstetrics negative OB ROS                           Anesthesia Physical Anesthesia Plan  ASA: II  Anesthesia Plan: General   Post-op Pain Management:    Induction: Intravenous  Airway Management Planned: Oral ETT and Video Laryngoscope Planned  Additional Equipment:   Intra-op Plan:   Post-operative Plan: Extubation in OR  Informed Consent: I have reviewed the patients History and Physical, chart, labs and discussed the procedure including the risks, benefits and alternatives for the proposed anesthesia with the patient or authorized representative who has indicated his/her understanding and acceptance.   Dental advisory given  Plan Discussed with: CRNA  Anesthesia Plan Comments:         Anesthesia Quick Evaluation

## 2013-04-19 NOTE — H&P (Signed)
Carla Barton is an 38 y.o. female.   Chief Complaint: peritonsillar abscess HPI: 38 year old female with acute tonsillitis last week that progressed to right peritonsillar abscess over the weekend.  Dr. Wilburn Cornelia attempted drainage in the ER without great flow of pus.  Carla Barton came to the office Wednesday with no improvement and I attempted redrainage and was able to aspirate 2 cc of pus.  Since then, symptoms have not improved.  Carla Barton could not swallow today and vomited so Carla Barton came back to the ER.  Pain and swelling continue.  Past Medical History  Diagnosis Date  . Arthritis   . Bronchitis     Past Surgical History  Procedure Laterality Date  . Tubular ligation    . Cholecystectomy      History reviewed. No pertinent family history. Social History:  reports that Carla Barton has never smoked. Carla Barton does not have any smokeless tobacco history on file. Carla Barton reports that Carla Barton does not drink alcohol or use illicit drugs.  Allergies:  Allergies  Allergen Reactions  . Shellfish Allergy Anaphylaxis and Itching  . Asa [Aspirin] Other (See Comments)    "asthma to flare"     (Not in a hospital admission)  Results for orders placed during the hospital encounter of 04/19/13 (from the past 48 hour(s))  CBC WITH DIFFERENTIAL     Status: Abnormal   Collection Time    04/19/13 10:30 AM      Result Value Range   WBC 28.6 (*) 4.0 - 10.5 K/uL   RBC 3.98  3.87 - 5.11 MIL/uL   Hemoglobin 10.6 (*) 12.0 - 15.0 g/dL   HCT 32.9 (*) 36.0 - 46.0 %   MCV 82.7  78.0 - 100.0 fL   MCH 26.6  26.0 - 34.0 pg   MCHC 32.2  30.0 - 36.0 g/dL   RDW 15.3  11.5 - 15.5 %   Platelets 403 (*) 150 - 400 K/uL   Neutrophils Relative % 86 (*) 43 - 77 %   Lymphocytes Relative 7 (*) 12 - 46 %   Monocytes Relative 6  3 - 12 %   Eosinophils Relative 1  0 - 5 %   Basophils Relative 0  0 - 1 %   Neutro Abs 24.6 (*) 1.7 - 7.7 K/uL   Lymphs Abs 2.0  0.7 - 4.0 K/uL   Monocytes Absolute 1.7 (*) 0.1 - 1.0 K/uL   Eosinophils Absolute  0.3  0.0 - 0.7 K/uL   Basophils Absolute 0.0  0.0 - 0.1 K/uL   Smear Review MORPHOLOGY UNREMARKABLE    BASIC METABOLIC PANEL     Status: Abnormal   Collection Time    04/19/13 10:30 AM      Result Value Range   Sodium 136 (*) 137 - 147 mEq/L   Potassium 3.8  3.7 - 5.3 mEq/L   Chloride 98  96 - 112 mEq/L   CO2 25  19 - 32 mEq/L   Glucose, Bld 98  70 - 99 mg/dL   BUN 6  6 - 23 mg/dL   Creatinine, Ser 0.82  0.50 - 1.10 mg/dL   Calcium 8.6  8.4 - 10.5 mg/dL   GFR calc non Af Amer >90  >90 mL/min   GFR calc Af Amer >90  >90 mL/min   Comment: (NOTE)     The eGFR has been calculated using the CKD EPI equation.     This calculation has not been validated in all clinical situations.  eGFR's persistently <90 mL/min signify possible Chronic Kidney     Disease.   No results found.  Review of Systems  Constitutional: Positive for fever.  HENT: Positive for ear pain and sore throat.   Gastrointestinal: Positive for vomiting.  Musculoskeletal: Positive for neck pain.  All other systems reviewed and are negative.    Blood pressure 134/98, pulse 98, temperature 99.3 F (37.4 C), temperature source Oral, resp. rate 22, last menstrual period 03/21/2013, SpO2 99.00%. Physical Exam  Constitutional: Carla Barton is oriented to person, place, and time. Carla Barton appears well-developed and well-nourished.  Clearly uncomfortable, pale.  Very muffled voice.  No stridor.  HENT:  Head: Normocephalic and atraumatic.  Right Ear: External ear normal.  Left Ear: External ear normal.  Nose: Nose normal.  Full right peritonsillar area/soft palate, small incision above right tonsil.  Eyes: Conjunctivae and EOM are normal. Pupils are equal, round, and reactive to light.  Neck: Normal range of motion.  Right zone 1 tenderness, some tenderness whole anterior neck without edema or crepitance.  Cardiovascular: Normal rate.   Respiratory: Effort normal.  GI:  Did not examine.  Genitourinary:  Did not examine.   Musculoskeletal: Normal range of motion.  Neurological: Carla Barton is alert and oriented to person, place, and time. No cranial nerve deficit.  Skin: Skin is warm and dry.  Psychiatric: Carla Barton has a normal mood and affect. Her behavior is normal. Judgment and thought content normal.     Assessment/Plan Right peritonsillar abscess, recurrent. Having failed two attempts to drain the abscess, I recommended proceeding with tonsillectomy.  Risks, benefits, and alternatives were discussed.  Carla Barton will be observed overnight after surgery.  Aidan Moten 04/19/2013, 11:54 AM

## 2013-04-19 NOTE — Brief Op Note (Signed)
04/19/2013  4:25 PM  PATIENT:  Carla Barton  38 y.o. female  PRE-OPERATIVE DIAGNOSIS:  recurrent peritonsillar abscess  POST-OPERATIVE DIAGNOSIS:  recurrent peritonsillar abscess  PROCEDURE:  Procedure(s): TONSILLECTOMY (Bilateral)  SURGEON:  Surgeon(s) and Role:    * Christia Readingwight Dhanvin Szeto, MD - Primary  PHYSICIAN ASSISTANT:   ASSISTANTS: none   ANESTHESIA:   general  EBL:  Total I/O In: 500 [I.V.:500] Out: -   BLOOD ADMINISTERED:none  DRAINS: none   LOCAL MEDICATIONS USED:  NONE  SPECIMEN:  Source of Specimen:  Right and left tonsils  DISPOSITION OF SPECIMEN:  PATHOLOGY  COUNTS:  YES  TOURNIQUET:  * No tourniquets in log *  DICTATION: .Other Dictation: Dictation Number [id number]  PLAN OF CARE: Admit for overnight observation  PATIENT DISPOSITION:  PACU - hemodynamically stable.   Delay start of Pharmacological VTE agent (>24hrs) due to surgical blood loss or risk of bleeding: yes

## 2013-04-19 NOTE — Transfer of Care (Signed)
Immediate Anesthesia Transfer of Care Note  Patient: Carla Barton  Procedure(s) Performed: Procedure(s): TONSILLECTOMY (Bilateral)  Patient Location: PACU  Anesthesia Type:General  Level of Consciousness: oriented, sedated, patient cooperative and responds to stimulation  Airway & Oxygen Therapy: Patient Spontanous Breathing and Patient connected to face mask oxygen  Post-op Assessment: Report given to PACU RN, Post -op Vital signs reviewed and stable, Patient moving all extremities and Patient moving all extremities X 4  Post vital signs: Reviewed and stable  Complications: No apparent anesthesia complications

## 2013-04-19 NOTE — ED Notes (Addendum)
Pt reports she is here for throat issues. Had abcess drained 1/4. Redrained on 1/7 since not getting better. Reports she is unable to take meds. Lots of mucous and unable to talk or swallow effectively. Left sided swelling to face noted.

## 2013-04-19 NOTE — Preoperative (Signed)
Beta Blockers   Reason not to administer Beta Blockers:Not Applicable 

## 2013-04-19 NOTE — Anesthesia Postprocedure Evaluation (Signed)
  Anesthesia Post-op Note  Patient: Carla Barton  Procedure(s) Performed: Procedure(s): TONSILLECTOMY (Bilateral)  Patient Location: PACU  Anesthesia Type:General  Level of Consciousness: awake  Airway and Oxygen Therapy: Patient Spontanous Breathing and Patient connected to face mask oxygen  Post-op Pain: mild  Post-op Assessment: Post-op Vital signs reviewed  Post-op Vital Signs: Reviewed  Complications: No apparent anesthesia complications

## 2013-04-20 NOTE — Progress Notes (Signed)
1 Day Post-Op  Subjective: Her throat still hurts and is swollen.  She is not tolerating oral intake very well yet.  Objective: Vital signs in last 24 hours: Temp:  [97.5 F (36.4 C)-98.7 F (37.1 C)] 98.5 F (36.9 C) (01/10 0557) Pulse Rate:  [93-112] 96 (01/10 0557) Resp:  [20] 20 (01/10 0557) BP: (128-167)/(32-110) 132/96 mmHg (01/10 0557) SpO2:  [96 %-100 %] 96 % (01/10 0557) FiO2 (%):  [28 %-35 %] 28 % (01/09 1753) Weight:  [117.935 kg (260 lb)] 117.935 kg (260 lb) (01/09 2200) Last BM Date: 04/17/13  Intake/Output from previous day: 01/09 0701 - 01/10 0700 In: 2563.3 [P.O.:290; I.V.:2173.3; IV Piggyback:100] Out: 50 [Blood:50] Intake/Output this shift:    General appearance: alert, cooperative, no distress and still hurts to talk, muffled voice. Throat: no bleeding  Lab Results:   Recent Labs  04/19/13 1030  WBC 28.6*  HGB 10.6*  HCT 32.9*  PLT 403*   BMET  Recent Labs  04/19/13 1030  NA 136*  K 3.8  CL 98  CO2 25  GLUCOSE 98  BUN 6  CREATININE 0.82  CALCIUM 8.6   PT/INR No results found for this basename: LABPROT, INR,  in the last 72 hours ABG No results found for this basename: PHART, PCO2, PO2, HCO3,  in the last 72 hours  Studies/Results: No results found.  Anti-infectives: Anti-infectives   Start     Dose/Rate Route Frequency Ordered Stop   04/19/13 2200  clindamycin (CLEOCIN) IVPB 600 mg     600 mg 100 mL/hr over 30 Minutes Intravenous 3 times per day 04/19/13 1819     04/19/13 0945  clindamycin (CLEOCIN) IVPB 600 mg     600 mg 100 mL/hr over 30 Minutes Intravenous  Once 04/19/13 0934 04/19/13 1033      Assessment/Plan: Recurrent peritonsillar abscess s/p Procedure(s): TONSILLECTOMY (Bilateral) Recovery will be slow due to infected status.  Continue IV antibiotics and hydration.  I encouraged her to try swallowing more but it will take time.  LOS: 1 day    Alena Blankenbeckler 04/20/2013

## 2013-04-20 NOTE — Op Note (Signed)
Carla Barton, Carla Barton NO.:  [id number]  MEDICAL RECORD NO.:  [id number]  LOCATION:  6N24C                        FACILITY:  [facility]  PHYSICIAN:  Antony Contras, MD     DATE OF BIRTH:  1975/11/16  DATE OF PROCEDURE:  04/19/2013 DATE OF DISCHARGE:                              OPERATIVE REPORT   PREOPERATIVE DIAGNOSES:  Recurrent right peritonsillar abscess and tonsillitis.  POSTOPERATIVE DIAGNOSES:  Recurrent right peritonsillar abscess and tonsillitis.  PROCEDURE:  Tonsillectomy.  SURGEON:  Antony Contras, MD  ANESTHESIA:  General endotracheal anesthesia.  COMPLICATIONS:  None.  INDICATION:  The patient is a 38 year old female who has recently had an episode of acute tonsillitis that progressed to a right peritonsillar abscess.  She had drainage performed on Sunday, but symptoms persisted. She had drainage again performed on Wednesday with removal of 2 mL of pus.  However, she had persistence of symptoms, and that even worse today and came to emergency department.  Her right oropharynx remains quite full and we decided to proceed with Quinsy tonsillectomy.  FINDINGS:  The right peritonsillar region was quite full and edematous. The tonsil was friable and broke apart during removal.  The peritonsillar space did not contain significant pus today.  The right soft palate is quite edematous with the uvula, pushed all the way to the left side.  Removal of the right tonsil was difficult and required a piecemeal removal with significant bleeding.  The left tonsil appeared more normal and was 2+ in size.  Tonsillar space was normal on the left side.  DESCRIPTION OF PROCEDURE:  The patient was identified in the holding room and informed consent having been obtained including discussion of risks, benefits, alternatives, the patient was brought to the operative suite table in supine position.  Anesthesia was induced.  The patient was intubated by the Anesthesia  Team using a glide scope with some difficulty.  The eyes taped closed and bed was turned 90 degrees from anesthesia.  She had received intravenous antibiotics in the emergency department.  A head wrap was placed around the patient's head and a Crowe-Davis retractor was inserted mouth to remove and opened to reveal the oropharynx.  This placed in suspension on Mayo stand using a rake. The right tonsil was grasped with a curved Allis and retracted immediately while a curvilinear incision was made along the right anterior tonsillar pillar starting in the region of the peritonsillar drainage incision.  Immediately, bleeding was encountered and was made dissection difficult.  The electrocautery was abandoned for blunt dissection using a Hurd elevator, which was used and very successfully to get into the peritonsillar space and dissecting behind the tonsil. The tonsil had been removed in a piecemeal fashion and all passed together to the nursing for pathology.  Inferiorly, dissection ended even with the tongue base.  A tonsil pack was placed in the right tonsil fossa.  The left tonsil was then grasped with a curved Allis and retracted medially while a curvilinear incision was made along the anterior tonsil using Bovie electrocautery on a setting of 20. Dissection continued in the subcapsular plane until the tonsil was removed.  This tonsil was also passed for  pathology.  Bleeding was controlled on this side first by packing with a tonsil pack and then using a suction cautery on a setting of 30.  The right tonsil pack was removed and similar cautery done.  There was more oozing, bleeding from the right side that required a fair amount of cautery.  Ultimately, a bleeding was largely controlled, and the nose and throat were copiously irrigated with saline.  A flexible catheter was passed down the esophagus to suck out the stomach and esophagus.  Retractor was taken out of suspension and removed  from the patient's mouth.  She was turned back to Anesthesia for wake up, was extubated, moved to recovery room in stable condition.     Antony Contraswight D [name], MD     DDB/MEDQ  D:  04/19/2013  T:  04/20/2013  Job:  [id number]

## 2013-04-21 LAB — CBC WITH DIFFERENTIAL/PLATELET
Basophils Absolute: 0 10*3/uL (ref 0.0–0.1)
Basophils Relative: 0 % (ref 0–1)
EOS PCT: 1 % (ref 0–5)
Eosinophils Absolute: 0.2 10*3/uL (ref 0.0–0.7)
HEMATOCRIT: 31.8 % — AB (ref 36.0–46.0)
HEMOGLOBIN: 9.9 g/dL — AB (ref 12.0–15.0)
LYMPHS ABS: 3.3 10*3/uL (ref 0.7–4.0)
LYMPHS PCT: 14 % (ref 12–46)
MCH: 26.1 pg (ref 26.0–34.0)
MCHC: 31.1 g/dL (ref 30.0–36.0)
MCV: 83.7 fL (ref 78.0–100.0)
MONO ABS: 1.7 10*3/uL — AB (ref 0.1–1.0)
Monocytes Relative: 7 % (ref 3–12)
Neutro Abs: 19.4 10*3/uL — ABNORMAL HIGH (ref 1.7–7.7)
Neutrophils Relative %: 79 % — ABNORMAL HIGH (ref 43–77)
Platelets: 423 10*3/uL — ABNORMAL HIGH (ref 150–400)
RBC: 3.8 MIL/uL — AB (ref 3.87–5.11)
RDW: 15.8 % — ABNORMAL HIGH (ref 11.5–15.5)
WBC: 24.6 10*3/uL — AB (ref 4.0–10.5)

## 2013-04-21 NOTE — Progress Notes (Signed)
2 Days Post-Op  Subjective: Complains most of thick secretions in the mouth/throat.  Pain somewhat better. Tolerating a little po's, still not much.  Ambulating.  Objective: Vital signs in last 24 hours: Temp:  [98 F (36.7 C)-98.3 F (36.8 C)] 98 F (36.7 C) (01/11 0547) Pulse Rate:  [95-114] 114 (01/11 0547) Resp:  [16-20] 20 (01/11 0547) BP: (130-148)/(89-93) 148/89 mmHg (01/11 0547) SpO2:  [97 %-100 %] 98 % (01/11 0547) Last BM Date: 04/17/13  Intake/Output from previous day: 01/10 0701 - 01/11 0700 In: 1061.7 [P.O.:100; I.V.:961.7] Out: 500 [Urine:500] Intake/Output this shift: Total I/O In: 60 [P.O.:60] Out: 500 [Urine:500]  General appearance: alert, cooperative, no distress and struggles to talk, voice still muffled. Throat: no bleeding  Lab Results:   Recent Labs  04/19/13 1030  WBC 28.6*  HGB 10.6*  HCT 32.9*  PLT 403*   BMET  Recent Labs  04/19/13 1030  NA 136*  K 3.8  CL 98  CO2 25  GLUCOSE 98  BUN 6  CREATININE 0.82  CALCIUM 8.6   PT/INR No results found for this basename: LABPROT, INR,  in the last 72 hours ABG No results found for this basename: PHART, PCO2, PO2, HCO3,  in the last 72 hours  Studies/Results: No results found.  Anti-infectives: Anti-infectives   Start     Dose/Rate Route Frequency Ordered Stop   04/19/13 2200  clindamycin (CLEOCIN) IVPB 600 mg     600 mg 100 mL/hr over 30 Minutes Intravenous 3 times per day 04/19/13 1819     04/19/13 0945  clindamycin (CLEOCIN) IVPB 600 mg     600 mg 100 mL/hr over 30 Minutes Intravenous  Once 04/19/13 0934 04/19/13 1033      Assessment/Plan: Recurrent peritonsillar abscess s/p Procedure(s): TONSILLECTOMY (Bilateral) Improving slowly.  Still not taking adequate po's to be discharged.  Will continue IV antibiotic and hydration.  Possible discharge tomorrow.  LOS: 2 days    Carla Barton 04/21/2013

## 2013-04-22 ENCOUNTER — Encounter (HOSPITAL_COMMUNITY): Payer: Self-pay | Admitting: Otolaryngology

## 2013-04-22 NOTE — Progress Notes (Signed)
3 Days Post-Op  Subjective: A little better with po intake today.  Easier to speak but still struggling somewhat.  Objective: Vital signs in last 24 hours: Temp:  [98.1 F (36.7 C)-98.8 F (37.1 C)] 98.2 F (36.8 C) (01/12 0605) Pulse Rate:  [100-104] 104 (01/12 0605) Resp:  [18-20] 20 (01/12 0605) BP: (141-142)/(76-93) 141/87 mmHg (01/12 0605) SpO2:  [97 %-99 %] 97 % (01/12 0605) Last BM Date: 04/17/13  Intake/Output from previous day: 01/11 0701 - 01/12 0700 In: 4383.3 [P.O.:300; I.V.:3883.3; IV Piggyback:200] Out: 2350 [Urine:2350] Intake/Output this shift:    General appearance: alert, cooperative and no distress Throat: voice muffled but easier to make, oropharynx with exudative tonsillar fossae, oropharynx symmetric in appearance with resolution of right-sided fullness/edema  Lab Results:   Recent Labs  04/19/13 1030 04/21/13 1630  WBC 28.6* 24.6*  HGB 10.6* 9.9*  HCT 32.9* 31.8*  PLT 403* 423*   BMET  Recent Labs  04/19/13 1030  NA 136*  K 3.8  CL 98  CO2 25  GLUCOSE 98  BUN 6  CREATININE 0.82  CALCIUM 8.6   PT/INR No results found for this basename: LABPROT, INR,  in the last 72 hours ABG No results found for this basename: PHART, PCO2, PO2, HCO3,  in the last 72 hours  Studies/Results: No results found.  Anti-infectives: Anti-infectives   Start     Dose/Rate Route Frequency Ordered Stop   04/19/13 2200  clindamycin (CLEOCIN) IVPB 600 mg     600 mg 100 mL/hr over 30 Minutes Intravenous 3 times per day 04/19/13 1819     04/19/13 0945  clindamycin (CLEOCIN) IVPB 600 mg     600 mg 100 mL/hr over 30 Minutes Intravenous  Once 04/19/13 0934 04/19/13 1033      Assessment/Plan: Recurrent right peritonsillar abscess s/p Procedure(s): TONSILLECTOMY (Bilateral) Gradually improving.  Oral intake somewhat better.  She will try further today with drinking and will be discharged when she is taking adequate po's.  Oropharynx looks better from right  infection standpoint, now just looks like post tonsillectomy.  LOS: 3 days    Carla Barton 04/22/2013

## 2013-04-23 MED ORDER — OXYCODONE-ACETAMINOPHEN 5-325 MG/5ML PO SOLN: 10.0000 mL | ORAL | Status: DC | PRN

## 2013-04-23 MED ORDER — AMOXICILLIN 250 MG/5ML PO SUSR
500.0000 mg | Freq: Three times a day (TID) | ORAL | Status: DC
  Administered 2013-04-23: 500 mg via ORAL
  Filled 2013-04-23 (×3): qty 10

## 2013-04-23 MED ORDER — OXYCODONE HCL 5 MG/5ML PO SOLN
10.0000 mg | ORAL | Status: DC | PRN
  Administered 2013-04-23: 10 mg via ORAL
  Filled 2013-04-23: qty 10

## 2013-04-23 MED ORDER — ACETAMINOPHEN 160 MG/5ML PO SOLN: 500.0000 mg | ORAL | Status: DC | PRN

## 2013-04-23 MED ORDER — AMOXICILLIN 250 MG/5ML PO SUSR: 500.0000 mg | Freq: Three times a day (TID) | ORAL | Status: DC

## 2013-04-23 NOTE — Progress Notes (Signed)
Discharge Note. Reviewed discharge teaching/instructions with pt. Rx's reviewed. Pt reported understanding. Pt shared that she doesn't feel ready to go home yet but thinks she will feel more comfortable about going home this afternoon. Pt expressed concern about work issues.

## 2013-04-23 NOTE — ED Provider Notes (Signed)
Medical screening examination/treatment/procedure(s) were conducted as a shared visit with non-physician practitioner(s) and myself.  I personally evaluated the patient during the encounter.  EKG Interpretation    Date/Time:    Ventricular Rate:    PR Interval:    QRS Duration:   QT Interval:    QTC Calculation:   R Axis:     Text Interpretation:             Patient with recurrent peritonsillar abscess, failed outpatient drainage x2. ENT to take to OR. Patient not experiencing any airway involvement.  Carla Creasehristopher J. Jeshurun Oaxaca, MD 04/23/13 [phone]

## 2013-04-23 NOTE — Progress Notes (Signed)
Pt has had delay in transportation. Pt reported that her mother was going to come pick her up but that her mother's car would not start. Pt reported that she had her debit card and will take a taxi home. The taxi is currently on the way here to pick pt up.

## 2013-04-23 NOTE — Discharge Summary (Signed)
Physician Discharge Summary  Patient ID: Carla Barton MRN: [id number] DOB/AGE: October 10, 1975 37 y.o.  Admit date: 04/19/2013 Discharge date: 04/23/2013  Admission Diagnoses: Recurrent peritonsillar abscess  Discharge Diagnoses: same   Discharged Condition: fair  Hospital Course: 38 year old female with acute tonsillitis that evolved into right peritonsillar abscess, symptoms of which did not respond well to two attempts at drainage over a week.  Ultimately, she was admitted for IV antibiotics, hydration, and underwent tonsillectomy.  Recovery has been slow after surgery with gradual improvement in swelling, pain, and ability to swallow.  Today, she is taking fairly good oral intake and is felt stable for discharge.  Consults: None  Significant Diagnostic Studies: None  Treatments: surgery: tonsillectomy  Discharge Exam: Blood pressure 135/91, pulse 93, temperature 98 F (36.7 C), temperature source Oral, resp. rate 16, height 5\' 7"  (1.702 m), weight 117.935 kg (260 lb), last menstrual period 03/21/2013, SpO2 98.00%. General appearance: alert, cooperative, no distress and fairly good voice, somewhat muffled Throat: bilateral tonsil fossae exudate, uvula midline  Disposition: 01-Home or Self Care  Discharge Orders   Future Orders Complete By Expires   Diet - low sodium heart healthy  As directed    Discharge instructions  As directed    Comments:     Drink plenty of fluids.  When able, soft diet.   Increase activity slowly  As directed        Medication List    STOP taking these medications       clindamycin 75 MG/5ML solution  Commonly known as:  CLEOCIN     HYDROcodone-acetaminophen 7.5-325 mg/15 ml solution  Commonly known as:  HYCET      TAKE these medications       amoxicillin 250 MG/5ML suspension  Commonly known as:  AMOXIL  Take 10 mLs (500 mg total) by mouth every 8 (eight) hours.     oxyCODONE-acetaminophen 5-325 MG/5ML solution  Commonly known as:   ROXICET  Take 10 mLs by mouth every 4 (four) hours as needed for severe pain.           Follow-up Information   Follow up with Riah Kehoe, MD. Schedule an appointment as soon as possible for a visit in 3 weeks.   Specialty:  Otolaryngology   Contact information:   [address] [place] [address] [phone]       Signed: Christia ReadingBATES, Rhodie Cienfuegos 04/23/2013, 12:01 PM

## 2013-09-24 ENCOUNTER — Emergency Department (HOSPITAL_COMMUNITY)
Admission: EM | Admit: 2013-09-24 | Discharge: 2013-09-24 | Disposition: A | Discharge status: Home / Self Care | Payer: Medicaid Other | Attending: Emergency Medicine | Admitting: Emergency Medicine

## 2013-09-24 ENCOUNTER — Encounter (HOSPITAL_COMMUNITY): Payer: Self-pay | Admitting: Emergency Medicine

## 2013-09-24 DIAGNOSIS — T148XXA Other injury of unspecified body region, initial encounter: Secondary | ICD-10-CM

## 2013-09-24 DIAGNOSIS — W230XXA Caught, crushed, jammed, or pinched between moving objects, initial encounter: Secondary | ICD-10-CM | POA: Insufficient documentation

## 2013-09-24 DIAGNOSIS — Z888 Allergy status to other drugs, medicaments and biological substances status: Secondary | ICD-10-CM | POA: Insufficient documentation

## 2013-09-24 DIAGNOSIS — M129 Arthropathy, unspecified: Secondary | ICD-10-CM | POA: Insufficient documentation

## 2013-09-24 DIAGNOSIS — S6000XA Contusion of unspecified finger without damage to nail, initial encounter: Secondary | ICD-10-CM | POA: Insufficient documentation

## 2013-09-24 DIAGNOSIS — Y929 Unspecified place or not applicable: Secondary | ICD-10-CM | POA: Insufficient documentation

## 2013-09-24 DIAGNOSIS — Y939 Activity, unspecified: Secondary | ICD-10-CM | POA: Insufficient documentation

## 2013-09-24 NOTE — ED Provider Notes (Signed)
CSN: [id number]     Arrival date & time 09/24/13  1444 History   First MD Initiated Contact with Patient 09/24/13 1555     Chief Complaint  Patient presents with  . Hand Injury     (Consider location/radiation/quality/duration/timing/severity/associated sxs/prior Treatment) HPI Comments: Patient presents to the emergency department with chief complaint of finger pain. She states that she closed her fingers in the door last night. She states that her symptoms have been improving, but on to have her fingers looked at. She is able to move her fingers. She has not taken anything for her symptoms. She reports mild pain.  The history is provided by the patient. No language interpreter was used.    Past Medical History  Diagnosis Date  . Arthritis   . Bronchitis    Past Surgical History  Procedure Laterality Date  . Tubular ligation    . Cholecystectomy    . Tonsillectomy Bilateral 04/19/2013    Procedure: TONSILLECTOMY;  Surgeon: Christia Readingwight Bates, MD;  [facility];  Service: ENT;  Laterality: Bilateral;   No family history on file. History  Substance Use Topics  . Smoking status: Never Smoker   . Smokeless tobacco: Not on file  . Alcohol Use: No   OB History   Grav Para Term Preterm Abortions TAB SAB Ect Mult Living                 Review of Systems  Constitutional: Negative for fever and chills.  Respiratory: Negative for shortness of breath.   Cardiovascular: Negative for chest pain.  Gastrointestinal: Negative for nausea, vomiting, diarrhea and constipation.  Genitourinary: Negative for dysuria.      Allergies  Shellfish allergy and Asa  Home Medications   Prior to Admission medications   Medication Sig Start Date End Date Taking? Authorizing Cynthis [place]  amoxicillin (AMOXIL) 250 MG/5ML suspension Take 10 mLs (500 mg total) by mouth every 8 (eight) hours. 04/23/13   Christia Readingwight Bates, MD  oxyCODONE-acetaminophen (ROXICET) 5-325 MG/5ML solution Take 10 mLs by mouth every 4  (four) hours as needed for severe pain. 04/23/13   Christia Readingwight Bates, MD   BP 112/82  Pulse 87  Temp(Src) 98.5 F (36.9 C) (Oral)  Resp 18  Ht 5\' 7"  (1.702 m)  Wt 272 lb 4 oz (123.492 kg)  BMI 42.63 kg/m2  SpO2 100% Physical Exam  Nursing note and vitals reviewed. Constitutional: She is oriented to person, place, and time. She appears well-developed and well-nourished.  HENT:  Head: Normocephalic and atraumatic.  Eyes: Conjunctivae and EOM are normal.  Neck: Normal range of motion.  Cardiovascular: Normal rate.   Pulmonary/Chest: Effort normal.  Abdominal: She exhibits no distension.  Musculoskeletal: Normal range of motion.  Mild ecchymosis of the fingertips, range of motion and strength is 5/5, no bony abnormality or deformity, no bony tenderness  Neurological: She is alert and oriented to person, place, and time.  Skin: Skin is dry.  Psychiatric: She has a normal mood and affect. Her behavior is normal. Judgment and thought content normal.    ED Course  Procedures (including critical care time) Labs Review Labs Reviewed - No data to display  Imaging Review No results found.   EKG Interpretation None      MDM   Final diagnoses:  Contusion    Patient with decreased fingertips. No evidence of fracture or tendon injury. Patient declines x-ray. Will discharge to home with instructions to use ibuprofen and ice. Patient understands and agrees with plan. She  states that her symptoms have been rapidly improving.    Roxy Horsemanobert Browning, PA-C 09/24/13 1620

## 2013-09-24 NOTE — ED Notes (Signed)
Lt. Hand was shut in  A door last night.  No deformity. Bruising on the fingertips no swelling or deformity.  Able to move fingers. +Radial pulse

## 2013-09-24 NOTE — ED Notes (Signed)
Pt declines wheelchair at discharge.  Pt escorted to lobby by RN.  

## 2013-09-24 NOTE — Discharge Instructions (Signed)

## 2013-09-25 NOTE — ED Provider Notes (Signed)
Medical screening examination/treatment/procedure(s) were performed by non-physician practitioner and as supervising physician I was immediately available for consultation/collaboration.   EKG Interpretation None        Donald W Wickline, MD 09/25/13 2025 

## 2013-10-16 ENCOUNTER — Encounter (HOSPITAL_COMMUNITY): Payer: Self-pay | Admitting: Emergency Medicine

## 2013-10-16 ENCOUNTER — Emergency Department (HOSPITAL_COMMUNITY)
Admission: EM | Admit: 2013-10-16 | Discharge: 2013-10-16 | Disposition: A | Discharge status: Home / Self Care | Payer: Medicaid Other | Attending: Emergency Medicine | Admitting: Emergency Medicine

## 2013-10-16 ENCOUNTER — Emergency Department (HOSPITAL_COMMUNITY): Payer: Medicaid Other

## 2013-10-16 DIAGNOSIS — W010XXA Fall on same level from slipping, tripping and stumbling without subsequent striking against object, initial encounter: Secondary | ICD-10-CM | POA: Diagnosis not present

## 2013-10-16 DIAGNOSIS — Z3202 Encounter for pregnancy test, result negative: Secondary | ICD-10-CM | POA: Insufficient documentation

## 2013-10-16 DIAGNOSIS — Y92009 Unspecified place in unspecified non-institutional (private) residence as the place of occurrence of the external cause: Secondary | ICD-10-CM | POA: Diagnosis not present

## 2013-10-16 DIAGNOSIS — S300XXA Contusion of lower back and pelvis, initial encounter: Secondary | ICD-10-CM | POA: Insufficient documentation

## 2013-10-16 DIAGNOSIS — Z8709 Personal history of other diseases of the respiratory system: Secondary | ICD-10-CM | POA: Diagnosis not present

## 2013-10-16 DIAGNOSIS — IMO0002 Reserved for concepts with insufficient information to code with codable children: Secondary | ICD-10-CM | POA: Diagnosis present

## 2013-10-16 DIAGNOSIS — Y9389 Activity, other specified: Secondary | ICD-10-CM | POA: Insufficient documentation

## 2013-10-16 DIAGNOSIS — W19XXXA Unspecified fall, initial encounter: Secondary | ICD-10-CM

## 2013-10-16 DIAGNOSIS — T148XXA Other injury of unspecified body region, initial encounter: Secondary | ICD-10-CM

## 2013-10-16 DIAGNOSIS — W108XXA Fall (on) (from) other stairs and steps, initial encounter: Secondary | ICD-10-CM | POA: Insufficient documentation

## 2013-10-16 DIAGNOSIS — M129 Arthropathy, unspecified: Secondary | ICD-10-CM | POA: Insufficient documentation

## 2013-10-16 DIAGNOSIS — Z792 Long term (current) use of antibiotics: Secondary | ICD-10-CM | POA: Diagnosis not present

## 2013-10-16 LAB — POC URINE PREG, ED: Preg Test, Ur: NEGATIVE

## 2013-10-16 MED ORDER — HYDROCODONE-ACETAMINOPHEN 5-325 MG PO TABS: 1.0000 | ORAL_TABLET | ORAL | Status: DC | PRN

## 2013-10-16 MED ORDER — OXYCODONE-ACETAMINOPHEN 5-325 MG PO TABS
1.0000 | ORAL_TABLET | Freq: Once | ORAL | Status: AC
  Administered 2013-10-16: 1 via ORAL
  Filled 2013-10-16: qty 1

## 2013-10-16 MED ORDER — NAPROXEN 500 MG PO TABS: 500.0000 mg | ORAL_TABLET | Freq: Two times a day (BID) | ORAL | Status: DC

## 2013-10-16 MED ORDER — OXYCODONE-ACETAMINOPHEN 5-325 MG PO TABS: 1.0000 | ORAL_TABLET | Freq: Once | ORAL | Status: DC

## 2013-10-16 NOTE — ED Notes (Signed)
Jimmey RalphParker, PA at bedside for evaluation.

## 2013-10-16 NOTE — Discharge Instructions (Signed)
Call for a follow up appointment with a Family or Primary Care Provider.  Return if Symptoms worsen.   Take medication as prescribed.  Ice your hip 3-4 times a day (see attached reference on how to make an ice pack). Visit [email or web address]-[email or web address]://www.ncdhhs.gov/dma/medicaid/finddoctor.htm to help find a Contractorrimary Doctor.   Emergency Department Resource Guide 1) Find a Doctor and Pay Out of Pocket Although you won't have to find out who is covered by your insurance plan, it is a good idea to ask around and get recommendations. You will then need to call the office and see if the doctor you have chosen will accept you as a new patient and what types of options they offer for patients who are self-pay. Some doctors offer discounts or will set up payment plans for their patients who do not have insurance, but you will need to ask so you aren't surprised when you get to your appointment.  2) Contact Your Local Health Department Not all health departments have doctors that can see patients for sick visits, but many do, so it is worth a call to see if yours does. If you don't know where your local health department is, you can check in your phone book. The CDC also has a tool to help you locate your state's health department, and many state websites also have listings of all of their local health departments.  3) Find a Walk-in Clinic If your illness is not likely to be very severe or complicated, you may want to try a walk in clinic. These are popping up all over the country in pharmacies, drugstores, and shopping centers. They're usually staffed by nurse practitioners or physician assistants that have been trained to treat common illnesses and complaints. They're usually fairly quick and inexpensive. However, if you have serious medical issues or chronic medical problems, these are probably not your best option.  No Primary Care Doctor: - Call Health Connect at  [phone] - they can help you locate a primary care doctor  that  accepts your insurance, provides certain services, etc. - Physician Referral Service- [phone]  Chronic Pain Problems: Organization         Address  Phone   Notes  [facility]  [phone] Patients need to be referred by their primary care doctor.   Medication Assistance: Organization         Address  Phone   Notes  [facility] Medication [facility] [address] [place], [place] [address] [phone]) [phone] --Must be a resident of [facility] [place] -- Must have NO insurance coverage whatsoever (no Medicaid/ Medicare, etc.) -- The pt. MUST have a primary care doctor that directs their care regularly and follows them in the community   MedAssist  [phone]   [employer]  [phone]    Agencies that provide inexpensive medical care: Organization         Address  Phone   Notes  [facility]  [phone]) [phone]   [facility]    [phone]   [facility] [address] [place], [place] [address] [phone]) [phone]   [facility] [place] [address], [place] [phone]) [phone]   Planned Parenthood    [phone]) [phone]   [facility]    [phone]   [facility] and [facility]  [address],  Phone:  [phone]) [phone], Fax:  [phone] Hours of Operation:  9  am - 6 pm, M-F.  Also accepts Medicaid/Medicare and self-pay.  [facility]  [address], [place] Phone: [phone], Fax: [phone]) [phone]. Hours of Operation:  8:30 am - 5:30 pm, M-F.  Also accepts Medicaid and self-pay.  [facility] [place] [address], [place] Phone: [phone]) [phone]   [facility] [address], [place], [place] [phone], Ext. [phone] Mondays & Thursdays: 7-9 AM.  First 15 patients are seen on a first come, first serve basis.    Medicaid-accepting [facility] Providers:  Organization          Address  Phone   Notes  [facility] [address], [place] [phone] Also accepts self-pay patients.  [facility] [address], [place]  [phone]) [phone]   [facility] [address], [place] [phone]   [facility] [address] [place] [address], [place] [phone]) [phone]   Renaye RakersVeita Bland [address], [place]   [phone] Only accepts [place] Access [place] patients after they have their name applied to their card.   Self-Pay (no insurance) in [facility]:  Organization         Address  Phone   Notes  Sickle Cell Patients, [facility] [address], [place] [phone]   [facility] [address], [place] [phone]   [facility] [place]  [address], [place] [phone]   [facility]/Dr. Osei-Bonsu  [address] [place] [address], [place] or [phone], [place] [phone] Phone number for both [place] and [place] locations is the same.  [facility] [address], [place] [phone]   [facility] [place] [address] [place] [address], [place] or [address] [phone]) [phone]   [facility] [address], [place] [phone]) [phone], phone; [phone], fax Sees patients 1st and 3rd Saturday of every month.  Must not qualify for public or private insurance (i.e. Medicaid, Medicare, [place] Health Choice, Veterans' Benefits)  Household income should be no more than 200% of the poverty level The clinic cannot treat you if you are pregnant or think you are pregnant  Sexually transmitted diseases are not treated at the clinic.    Dental Care: Organization         Address  Phone  Notes  [facility] Department of [facility] [address], [place] [phone] Accepts children up to age 36 who are enrolled in [place]icaid or [place] Health Choice; pregnant women with a Medicaid card; and children who have applied for Medicaid or [place] Health Choice, but were declined, whose parents can pay a reduced fee at time of service.  [facility] Department of [facility] [place]  [address], [place] [phone]) [phone] Accepts children up to age 36 who are enrolled in [place]icaid or [place] Health Choice; pregnant women with a Medicaid card; and children who have applied for Medicaid or [place] Health Choice, but were declined, whose parents can pay a reduced fee at time of service.  [facility]  [address], [place] [phone] Patients are seen by appointment only. Walk-ins are not accepted. [facility] will see patients 18 years of  age and older. Monday - Tuesday (8am-5pm) Most Wednesdays (8:30-5pm) $30 per visit, cash only  [facility] Access PROGRAM  [address], [facility] [phone] Patients are seen by appointment only. Walk-ins are not accepted. [place] will see patients 57 years of age and older. One Wednesday Evening (Monthly: Volunteer Based).  $30 per visit, cash only  [place]  [phone] for adults; Children under age 78, call Graduate Pediatric Dentistry at [phone]. Children aged 43-14, please call [phone] to request a pediatric application.  Dental services are provided in all areas of dental care including fillings, crowns and bridges, complete and partial dentures, implants, gum treatment, root canals, and extractions. Preventive care is also provided. Treatment is provided to both adults and children. Patients are selected via a lottery and there is often a waiting list.   [facility] [address] [place]  [phone] [email or web address]   [facility] [address] [place], [place]  [phone], Ext. [phone] Second and Fourth Thursday of each month, opens at 6:30 AM; Clinic ends at 9 AM.  Patients are seen on a first-come first-served basis, and a limited number are seen during each clinic.   [facility]  [address] Hillard Danker [place], [place] [phone]   Eligibility Requirements You must have lived in [place], [place], or [place] counties for at least the last three months.   You cannot be eligible for state or federal sponsored [employer], including [employer], [place], or [employer].   You generally cannot be eligible for healthcare insurance through your employer.    How to apply: Eligibility screenings are held every Tuesday and Wednesday afternoon from 1:00 pm until 4:00 pm. You do not need an appointment for the interview!  [facility] [address] [place], [place]   [place]  [place] Department  [place]  [phone]    Behavioral Health Resources in the Community: Intensive Outpatient Programs Organization         Address  Phone  Notes  [place]. [address], [place], [place] [phone]   [facility] Outpatient [address], [place], [place]   ADS: Alcohol & Drug Svcs [address] [place], [place]   [place] [address],  [place], [place] or [phone]   Substance Abuse Resources Organization         Address  Phone  Notes  Alcohol and Drug Services  [phone]   [place]  [phone]   The [place]   Chinita Pester  [phone]   Residential & Outpatient Substance Abuse Program  [phone]   Psychological Services Organization         Address  Phone  Notes  [facility] [place]  [place]  [phone]    [facility] [address] [place] or [phone]    Mobile Crisis Teams Organization         Address  Phone  Notes  [facility], Mobile Crisis Care Unit  [phone]   Assertive Psychotherapeutic Services  [address] [place], [place]   [facility] [address] [place] [phone]    Self-Help/Support Groups Organization  Address  Phone             Notes  [place]. of [place] - variety of support groups  [place] Call for more information  Narcotics Anonymous (NA), [facility] [address], [employer] [place]  2 meetings at this location   Special educational needs teacher         Address  Phone  Notes  ASAP Residential Treatment [place],    [place]  [phone]   [facility] [place] [facility]  [address] [place] 996924, [place], [place]   [place], [place] [phone] Admissions: 8am-3pm M-F  Incentives Substance [place] [address]    [place], [place] [phone]   [facility] [address] [place], [place], [place]   [facility] [address]  [place], [place]   Insight Programs - Intensive Outpatient [address], [place], [place]   [facility] ([place].,  [place], [place] [phone] or [phone]   [facility]idential Treatment Services (RTS) [address] [place], [place] Accepts Medicaid  [facility] [place] [address]  [place] [phone] Substance Abuse/Addiction Treatment   [facility] Organization         Address  Phone  Notes  [facility]  [phone]   Domenic Schwab, PhD [address] Arlis Porta [place], [place]   [phone] or [phone]   [place], [place] [phone]   [facility] [address], [place], [place] [phone] Insurance/Medicaid/sponsorship through [facility] and Families [address] [place]                                    [place], [place] [phone] [place] [address], [place] [phone]    Dr. Adele Schilder  [phone]   [facility] [place] Dept. 1) [address] [place] 2) [place] 3)  [place] [address], [place] [phone]  [phone]   [place] [phone] or [phone] (After Hours)

## 2013-10-16 NOTE — ED Notes (Signed)
Per pt sts she fell and slid down the stairs on her bottom this am and is having left back and buttocks pain. sts stairs have carpet.

## 2013-10-16 NOTE — ED Provider Notes (Signed)
Medical screening examination/treatment/procedure(s) were performed by non-physician practitioner and as supervising physician I was immediately available for consultation/collaboration.   EKG Interpretation None        Adelei Scobey M Aryannah Mohon, MD 10/16/13 1554 

## 2013-10-16 NOTE — ED Provider Notes (Signed)
CSN: [id number]     Arrival date & time 10/16/13  [id number] History   First MD Initiated Contact with Patient 10/16/13 0809     Chief Complaint  Patient presents with  . Fall  . Back Pain     (Consider location/radiation/quality/duration/timing/severity/associated sxs/prior Treatment) HPI Comments: The patient is a 38 year old female presenting to the emergency department with a chief complaint of fall at home. The patient reports approximately 30 minutes prior to arrival she slipped on her pajama pants and fell on a carpeted stairs.  She reports left posterior hip pain. Denies loss of consciousness, blow to head. She was ambulatory at the scene but reports discomfort with movement and the seated position. LNMP 09/29/2013  Patient is a 38 y.o. female presenting with fall and back pain. The history is provided by the patient. No language interpreter was used.  Fall This is a new problem. The current episode started today. The problem has been unchanged. Pertinent negatives include no abdominal pain, chills, fever, headaches, neck pain, numbness, vomiting or weakness. The symptoms are aggravated by standing and walking. She has tried nothing for the symptoms.  Back Pain Associated symptoms: no abdominal pain, no fever, no headaches, no numbness and no weakness     Past Medical History  Diagnosis Date  . Arthritis   . Bronchitis    Past Surgical History  Procedure Laterality Date  . Tubular ligation    . Cholecystectomy    . Tonsillectomy Bilateral 04/19/2013    Procedure: TONSILLECTOMY;  Surgeon: Christia Readingwight Bates, MD;  [facility];  Service: ENT;  Laterality: Bilateral;   History reviewed. No pertinent family history. History  Substance Use Topics  . Smoking status: Never Smoker   . Smokeless tobacco: Not on file  . Alcohol Use: No   OB History   Grav Para Term Preterm Abortions TAB SAB Ect Mult Living                 Review of Systems  Constitutional: Negative for fever and  chills.  Gastrointestinal: Negative for vomiting and abdominal pain.  Musculoskeletal: Positive for back pain. Negative for neck pain.  Skin: Negative for color change and wound.  Neurological: Negative for syncope, weakness, light-headedness, numbness and headaches.      Allergies  Shellfish allergy and Asa  Home Medications   Prior to Admission medications   Medication Sig Start Date End Date Taking? Authorizing Provider  amoxicillin (AMOXIL) 250 MG/5ML suspension Take 10 mLs (500 mg total) by mouth every 8 (eight) hours. 04/23/13   Christia Readingwight Bates, MD  oxyCODONE-acetaminophen (ROXICET) 5-325 MG/5ML solution Take 10 mLs by mouth every 4 (four) hours as needed for severe pain. 04/23/13   Christia Readingwight Bates, MD   BP 113/70  Pulse 93  Temp(Src) 98.2 F (36.8 C)  Resp 18  SpO2 100%  LMP 09/29/2013 Physical Exam  Nursing note and vitals reviewed. Constitutional: She is oriented to person, place, and time. She appears well-developed and well-nourished.  Non-toxic appearance. She does not have a sickly appearance. She does not appear ill. No distress.  HENT:  Head: Normocephalic and atraumatic.  Eyes: EOM are normal. Pupils are equal, round, and reactive to light.  Neck: Normal range of motion. Neck supple.  Pulmonary/Chest: Effort normal. No respiratory distress.  Musculoskeletal:       Lumbar back: She exhibits tenderness.       Back:  Tenderness palpation over the left buttocks, normal sensation distally. No midline tenderness or obvious deformity. No obvious  spasm. No ecchymosis or erythema. Exam limited by body habitus. Normal sensation in lower extremities. Able to bear weight in ED. Full active ROM.  Neurological: She is alert and oriented to person, place, and time.  Skin: Skin is warm and dry. She is not diaphoretic.  Psychiatric: She has a normal mood and affect. Her behavior is normal.    ED Course  Procedures (including critical care time) Labs Review Labs Reviewed  POC  URINE PREG, ED    Imaging Review Dg Hip Complete Left  10/16/2013   CLINICAL DATA:  Fall.  EXAM: LEFT HIP - COMPLETE 2+ VIEW  COMPARISON:  None.  FINDINGS: No acute bony or joint abnormality identified. No evidence of fracture or dislocation .  IMPRESSION: No acute abnormality identified.   Electronically Signed   By: Maisie Fushomas  Register   On: 10/16/2013 09:50      MDM   Final diagnoses:  Contusion  Fall at home, initial encounter   Patient presents after a fall at home. No signs of trauma on exam, patient is refusing to give urine sample despite being asked time for a pregnancy. Will not order a x-ray without previous pregnancy test. Patient was given ice and refused to use it as well. He should finally agreed to urine pregnancy and in E., negative x-ray negative for acute findings. Plan to discharge with pain control. Discussed imaging results, and treatment plan with the patient. Return precautions given. Reports understanding and no other concerns at this time.  Patient is stable for discharge at this time.  Meds given in ED:  Medications  oxyCODONE-acetaminophen (PERCOCET/ROXICET) 5-325 MG per tablet 1 tablet (1 tablet Oral Given 10/16/13 0835)    New Prescriptions   HYDROCODONE-ACETAMINOPHEN (NORCO/VICODIN) 5-325 MG PER TABLET    Take 1 tablet by mouth every 4 (four) hours as needed for moderate pain or severe pain.   NAPROXEN (NAPROSYN) 500 MG TABLET    Take 1 tablet (500 mg total) by mouth 2 (two) times daily with a meal.       Clabe SealLauren M Coti Burd, PA-C 10/16/13 1023

## 2013-11-21 ENCOUNTER — Encounter (HOSPITAL_COMMUNITY): Payer: Self-pay | Admitting: Emergency Medicine

## 2013-11-21 ENCOUNTER — Emergency Department (HOSPITAL_COMMUNITY)
Admission: EM | Admit: 2013-11-21 | Discharge: 2013-11-21 | Disposition: A | Discharge status: Home / Self Care | Payer: Medicaid Other | Attending: Emergency Medicine | Admitting: Emergency Medicine

## 2013-11-21 DIAGNOSIS — Z3202 Encounter for pregnancy test, result negative: Secondary | ICD-10-CM | POA: Diagnosis not present

## 2013-11-21 DIAGNOSIS — R109 Unspecified abdominal pain: Secondary | ICD-10-CM | POA: Insufficient documentation

## 2013-11-21 DIAGNOSIS — J45909 Unspecified asthma, uncomplicated: Secondary | ICD-10-CM | POA: Diagnosis not present

## 2013-11-21 DIAGNOSIS — N390 Urinary tract infection, site not specified: Secondary | ICD-10-CM | POA: Diagnosis not present

## 2013-11-21 DIAGNOSIS — R112 Nausea with vomiting, unspecified: Secondary | ICD-10-CM | POA: Insufficient documentation

## 2013-11-21 DIAGNOSIS — Z791 Long term (current) use of non-steroidal anti-inflammatories (NSAID): Secondary | ICD-10-CM | POA: Insufficient documentation

## 2013-11-21 HISTORY — DX: Unspecified asthma, uncomplicated: J45.909

## 2013-11-21 LAB — COMPREHENSIVE METABOLIC PANEL
ALBUMIN: 3.3 g/dL — AB (ref 3.5–5.2)
ALK PHOS: 79 U/L (ref 39–117)
ALT: 10 U/L (ref 0–35)
AST: 17 U/L (ref 0–37)
Anion gap: 10 (ref 5–15)
BILIRUBIN TOTAL: 0.3 mg/dL (ref 0.3–1.2)
BUN: 10 mg/dL (ref 6–23)
CHLORIDE: 106 meq/L (ref 96–112)
CO2: 26 mEq/L (ref 19–32)
Calcium: 8.5 mg/dL (ref 8.4–10.5)
Creatinine, Ser: 0.95 mg/dL (ref 0.50–1.10)
GFR calc Af Amer: 88 mL/min — ABNORMAL LOW (ref >90)
GFR calc non Af Amer: 76 mL/min — ABNORMAL LOW (ref >90)
Glucose, Bld: 101 mg/dL — ABNORMAL HIGH (ref 70–99)
POTASSIUM: 3.9 meq/L (ref 3.7–5.3)
SODIUM: 142 meq/L (ref 137–147)
Total Protein: 7.1 g/dL (ref 6.0–8.3)

## 2013-11-21 LAB — CBC WITH DIFFERENTIAL/PLATELET
Basophils Absolute: 0 10*3/uL (ref 0.0–0.1)
Basophils Relative: 0 % (ref 0–1)
EOS ABS: 0.3 10*3/uL (ref 0.0–0.7)
Eosinophils Relative: 3 % (ref 0–5)
HEMATOCRIT: 31.3 % — AB (ref 36.0–46.0)
HEMOGLOBIN: 9.4 g/dL — AB (ref 12.0–15.0)
LYMPHS ABS: 3.7 10*3/uL (ref 0.7–4.0)
Lymphocytes Relative: 33 % (ref 12–46)
MCH: 23 pg — AB (ref 26.0–34.0)
MCHC: 30 g/dL (ref 30.0–36.0)
MCV: 76.5 fL — ABNORMAL LOW (ref 78.0–100.0)
MONOS PCT: 7 % (ref 3–12)
Monocytes Absolute: 0.8 10*3/uL (ref 0.1–1.0)
NEUTROS ABS: 6.3 10*3/uL (ref 1.7–7.7)
NEUTROS PCT: 57 % (ref 43–77)
Platelets: 440 10*3/uL — ABNORMAL HIGH (ref 150–400)
RBC: 4.09 MIL/uL (ref 3.87–5.11)
RDW: 17.3 % — ABNORMAL HIGH (ref 11.5–15.5)
WBC: 11.2 10*3/uL — ABNORMAL HIGH (ref 4.0–10.5)

## 2013-11-21 LAB — URINALYSIS, ROUTINE W REFLEX MICROSCOPIC
Bilirubin Urine: NEGATIVE
Glucose, UA: NEGATIVE mg/dL
Ketones, ur: NEGATIVE mg/dL
NITRITE: POSITIVE — AB
Protein, ur: 30 mg/dL — AB
SPECIFIC GRAVITY, URINE: 1.031 — AB (ref 1.005–1.030)
Urobilinogen, UA: 0.2 mg/dL (ref 0.0–1.0)
pH: 5.5 (ref 5.0–8.0)

## 2013-11-21 LAB — POC URINE PREG, ED: PREG TEST UR: NEGATIVE

## 2013-11-21 LAB — URINE MICROSCOPIC-ADD ON

## 2013-11-21 LAB — LIPASE, BLOOD: Lipase: 34 U/L (ref 11–59)

## 2013-11-21 MED ORDER — ONDANSETRON HCL 4 MG PO TABS: 4.0000 mg | ORAL_TABLET | Freq: Four times a day (QID) | ORAL | Status: DC

## 2013-11-21 MED ORDER — ONDANSETRON HCL 4 MG/2ML IJ SOLN
4.0000 mg | Freq: Once | INTRAMUSCULAR | Status: AC
  Administered 2013-11-21: 4 mg via INTRAVENOUS
  Filled 2013-11-21: qty 2

## 2013-11-21 MED ORDER — DEXTROSE 5 % IV SOLN
1.0000 g | Freq: Once | INTRAVENOUS | Status: AC
  Administered 2013-11-21: 1 g via INTRAVENOUS
  Filled 2013-11-21: qty 10

## 2013-11-21 MED ORDER — SODIUM CHLORIDE 0.9 % IV BOLUS (SEPSIS)
1000.0000 mL | Freq: Once | INTRAVENOUS | Status: AC
  Administered 2013-11-21: 1000 mL via INTRAVENOUS

## 2013-11-21 MED ORDER — CEPHALEXIN 500 MG PO CAPS: 500.0000 mg | ORAL_CAPSULE | Freq: Four times a day (QID) | ORAL | Status: DC

## 2013-11-21 NOTE — ED Provider Notes (Signed)
CSN: [id number]     Arrival date & time 11/21/13  5188 History   First MD Initiated Contact with Patient 11/21/13 [phone]     Chief Complaint  Patient presents with  . Abdominal Pain     (Consider location/radiation/quality/duration/timing/severity/associated sxs/prior Treatment) HPI Comments: Patient presents to the emergency department with no pertinent past medical history, with chief complaint of nausea, vomiting, and diarrhea x2 weeks. To clarify the nursing note, she doesn't have abdominal pain, but states that it is more a feeling of unrest in her stomach. She states that she is able to keep down ginger ale, but everything else she either vomits or has diarrhea. She denies any bloody vomit or bloody stools. She denies any fevers, chills, chest pain, shortness of breath, abdominal pain, dysuria, or vaginal discharge. She has not taken anything to alleviate the symptoms. There are no aggravating or relieving factors. Prior abdominal surgeries include cholecystectomy and tubal ligation.  The history is provided by the patient. No language interpreter was used.    Past Medical History  Diagnosis Date  . Bronchitis   . Asthma    Past Surgical History  Procedure Laterality Date  . Tubular ligation    . Cholecystectomy    . Tonsillectomy Bilateral 04/19/2013    Procedure: TONSILLECTOMY;  Surgeon: Christia Reading, MD;  Location: [facility] OR;  Service: ENT;  Laterality: Bilateral;   No family history on file. History  Substance Use Topics  . Smoking status: Never Smoker   . Smokeless tobacco: Not on file  . Alcohol Use: No   OB History   Grav Para Term Preterm Abortions TAB SAB Ect Mult Living                 Review of Systems  All other systems reviewed and are negative.     Allergies  Shellfish allergy and Asa  Home Medications   Prior to Admission medications   Medication Sig Start Date End Date Taking? Authorizing Provider  HYDROcodone-acetaminophen (NORCO/VICODIN) 5-325 MG  per tablet Take 1 tablet by mouth every 4 (four) hours as needed for moderate pain or severe pain. 10/16/13   Mellody Drown, PA-C  naproxen (NAPROSYN) 500 MG tablet Take 1 tablet (500 mg total) by mouth 2 (two) times daily with a meal. 10/16/13   Mellody Drown, PA-C   BP 125/92  Pulse 87  Temp(Src) 98.2 F (36.8 C) (Oral)  Resp 18  Ht 5\' 7"  (1.702 m)  Wt 270 lb (122.471 kg)  BMI 42.28 kg/m2  SpO2 100%  LMP 10/29/2013 Physical Exam  Nursing note and vitals reviewed. Constitutional: She is oriented to person, place, and time. She appears well-developed and well-nourished.  HENT:  Head: Normocephalic and atraumatic.  Eyes: Conjunctivae and EOM are normal. Pupils are equal, round, and reactive to light.  Neck: Normal range of motion. Neck supple.  Cardiovascular: Normal rate and regular rhythm.  Exam reveals no gallop and no friction rub.   No murmur heard. Pulmonary/Chest: Effort normal and breath sounds normal. No respiratory distress. She has no wheezes. She has no rales. She exhibits no tenderness.  Abdominal: Soft. Bowel sounds are normal. She exhibits no distension and no mass. There is no tenderness. There is no rebound and no guarding.  No focal abdominal tenderness, no RLQ tenderness or pain at McBurney's point, no RUQ tenderness or Murphy's sign, no left-sided abdominal tenderness, no fluid wave, or signs of peritonitis   Musculoskeletal: Normal range of motion. She exhibits no edema and no  tenderness.  Neurological: She is alert and oriented to person, place, and time.  Skin: Skin is warm and dry.  Psychiatric: She has a normal mood and affect. Her behavior is normal. Judgment and thought content normal.    ED Course  Procedures (including critical care time) Results for orders placed during the hospital encounter of 11/21/13  CBC WITH DIFFERENTIAL      Result Value Ref Range   WBC 11.2 (*) 4.0 - 10.5 K/uL   RBC 4.09  3.87 - 5.11 MIL/uL   Hemoglobin 9.4 (*) 12.0 - 15.0 g/dL    HCT 16.1 (*) 09.6 - 46.0 %   MCV 76.5 (*) 78.0 - 100.0 fL   MCH 23.0 (*) 26.0 - 34.0 pg   MCHC 30.0  30.0 - 36.0 g/dL   RDW 04.5 (*) 40.9 - 81.1 %   Platelets 440 (*) 150 - 400 K/uL   Neutrophils Relative % 57  43 - 77 %   Neutro Abs 6.3  1.7 - 7.7 K/uL   Lymphocytes Relative 33  12 - 46 %   Lymphs Abs 3.7  0.7 - 4.0 K/uL   Monocytes Relative 7  3 - 12 %   Monocytes Absolute 0.8  0.1 - 1.0 K/uL   Eosinophils Relative 3  0 - 5 %   Eosinophils Absolute 0.3  0.0 - 0.7 K/uL   Basophils Relative 0  0 - 1 %   Basophils Absolute 0.0  0.0 - 0.1 K/uL  COMPREHENSIVE METABOLIC PANEL      Result Value Ref Range   Sodium 142  137 - 147 mEq/L   Potassium 3.9  3.7 - 5.3 mEq/L   Chloride 106  96 - 112 mEq/L   CO2 26  19 - 32 mEq/L   Glucose, Bld 101 (*) 70 - 99 mg/dL   BUN 10  6 - 23 mg/dL   Creatinine, Ser 9.14  0.50 - 1.10 mg/dL   Calcium 8.5  8.4 - 78.2 mg/dL   Total Protein 7.1  6.0 - 8.3 g/dL   Albumin 3.3 (*) 3.5 - 5.2 g/dL   AST 17  0 - 37 U/L   ALT 10  0 - 35 U/L   Alkaline Phosphatase 79  39 - 117 U/L   Total Bilirubin 0.3  0.3 - 1.2 mg/dL   GFR calc non Af Amer 76 (*) >90 mL/min   GFR calc Af Amer 88 (*) >90 mL/min   Anion gap 10  5 - 15  LIPASE, BLOOD      Result Value Ref Range   Lipase 34  11 - 59 U/L  URINALYSIS, ROUTINE W REFLEX MICROSCOPIC      Result Value Ref Range   Color, Urine YELLOW  YELLOW   APPearance CLOUDY (*) CLEAR   Specific Gravity, Urine 1.031 (*) 1.005 - 1.030   pH 5.5  5.0 - 8.0   Glucose, UA NEGATIVE  NEGATIVE mg/dL   Hgb urine dipstick MODERATE (*) NEGATIVE   Bilirubin Urine NEGATIVE  NEGATIVE   Ketones, ur NEGATIVE  NEGATIVE mg/dL   Protein, ur 30 (*) NEGATIVE mg/dL   Urobilinogen, UA 0.2  0.0 - 1.0 mg/dL   Nitrite POSITIVE (*) NEGATIVE   Leukocytes, UA SMALL (*) NEGATIVE  URINE MICROSCOPIC-ADD ON      Result Value Ref Range   Squamous Epithelial / LPF MANY (*) RARE   WBC, UA 21-50  <3 WBC/hpf   RBC / HPF 3-6  <3 RBC/hpf   Bacteria,  UA  MANY (*) RARE  POC URINE PREG, ED      Result Value Ref Range   Preg Test, Ur NEGATIVE  NEGATIVE   No results found.    EKG Interpretation None      MDM   Final diagnoses:  UTI (lower urinary tract infection)  Non-intractable vomiting with nausea, vomiting of unspecified type    Patient with n/v/d x 2 weeks. Abdomen is soft and non-tender.  No evidence of acute abdomen.  Hx of cholecystectomy.  Will check labs and reassess.  UA remarkable for findings of UTI. Will give Rocephin, and fluid challenge, and anticipate discharge to home.  Patient is tolerating oral intake.  Hemoglobin is low, but about baseline from prior labs.   Roxy Horsemanobert Cecia Egge, PA-C 11/21/13 [phone]

## 2013-11-21 NOTE — ED Notes (Signed)
Pt c/o diffuse abd pain, n/v/d x 2 weeks. Pt sts she is able to keep ginger-ale down but anything else she either vomits 20-30 mins later or has an episode of diarrhea. Nad, skin warm and dry, resp e/u.

## 2013-11-21 NOTE — ED Notes (Signed)
Pt able to keep food and liquids down.

## 2013-11-21 NOTE — Discharge Instructions (Signed)
You have been found to have a UTI today.  This can cause nausea and vomiting.  Please take your medications as prescribed.  Please follow-up with gastroenterology regarding colon cancer screening.  It is important to do this soon because of you family history of early onset colon cancer.  Urinary Tract Infection Urinary tract infections (UTIs) can develop anywhere along your urinary tract. Your urinary tract is your body's drainage system for removing wastes and extra water. Your urinary tract includes two kidneys, two ureters, a bladder, and a urethra. Your kidneys are a pair of bean-shaped organs. Each kidney is about the size of your fist. They are located below your ribs, one on each side of your spine. CAUSES Infections are caused by microbes, which are microscopic organisms, including fungi, viruses, and bacteria. These organisms are so small that they can only be seen through a microscope. Bacteria are the microbes that most commonly cause UTIs. SYMPTOMS  Symptoms of UTIs may vary by age and gender of the patient and by the location of the infection. Symptoms in young women typically include a frequent and intense urge to urinate and a painful, burning feeling in the bladder or urethra during urination. Older women and men are more likely to be tired, shaky, and weak and have muscle aches and abdominal pain. A fever may mean the infection is in your kidneys. Other symptoms of a kidney infection include pain in your back or sides below the ribs, nausea, and vomiting. DIAGNOSIS To diagnose a UTI, your caregiver will ask you about your symptoms. Your caregiver also will ask to provide a urine sample. The urine sample will be tested for bacteria and white blood cells. White blood cells are made by your body to help fight infection. TREATMENT  Typically, UTIs can be treated with medication. Because most UTIs are caused by a bacterial infection, they usually can be treated with the use of antibiotics.  The choice of antibiotic and length of treatment depend on your symptoms and the type of bacteria causing your infection. HOME CARE INSTRUCTIONS  If you were prescribed antibiotics, take them exactly as your caregiver instructs you. Finish the medication even if you feel better after you have only taken some of the medication.  Drink enough water and fluids to keep your urine clear or pale yellow.  Avoid caffeine, tea, and carbonated beverages. They tend to irritate your bladder.  Empty your bladder often. Avoid holding urine for long periods of time.  Empty your bladder before and after sexual intercourse.  After a bowel movement, women should cleanse from front to back. Use each tissue only once. SEEK MEDICAL CARE IF:   You have back pain.  You develop a fever.  Your symptoms do not begin to resolve within 3 days. SEEK IMMEDIATE MEDICAL CARE IF:   You have severe back pain or lower abdominal pain.  You develop chills.  You have nausea or vomiting.  You have continued burning or discomfort with urination. MAKE SURE YOU:   Understand these instructions.  Will watch your condition.  Will get help right away if you are not doing well or get worse. Document Released: 01/05/2005 Document Revised: 09/27/2011 Document Reviewed: 05/06/2011 [facility] Patient Information 2015 [employer], [place]. This information is not intended to replace advice given to you by your health care provider. Make sure you discuss any questions you have with your health care provider.

## 2013-11-22 ENCOUNTER — Emergency Department (HOSPITAL_COMMUNITY): Payer: Medicaid Other

## 2013-11-22 ENCOUNTER — Emergency Department (HOSPITAL_COMMUNITY)
Admission: EM | Admit: 2013-11-22 | Discharge: 2013-11-22 | Disposition: A | Discharge status: Home / Self Care | Payer: Medicaid Other | Attending: Emergency Medicine | Admitting: Emergency Medicine

## 2013-11-22 ENCOUNTER — Encounter (HOSPITAL_COMMUNITY): Payer: Self-pay | Admitting: Emergency Medicine

## 2013-11-22 DIAGNOSIS — R112 Nausea with vomiting, unspecified: Secondary | ICD-10-CM | POA: Diagnosis not present

## 2013-11-22 DIAGNOSIS — Z792 Long term (current) use of antibiotics: Secondary | ICD-10-CM | POA: Diagnosis not present

## 2013-11-22 DIAGNOSIS — N390 Urinary tract infection, site not specified: Secondary | ICD-10-CM | POA: Diagnosis not present

## 2013-11-22 DIAGNOSIS — R197 Diarrhea, unspecified: Secondary | ICD-10-CM | POA: Insufficient documentation

## 2013-11-22 DIAGNOSIS — Z3202 Encounter for pregnancy test, result negative: Secondary | ICD-10-CM | POA: Diagnosis not present

## 2013-11-22 DIAGNOSIS — J45909 Unspecified asthma, uncomplicated: Secondary | ICD-10-CM | POA: Insufficient documentation

## 2013-11-22 DIAGNOSIS — Z79899 Other long term (current) drug therapy: Secondary | ICD-10-CM | POA: Diagnosis not present

## 2013-11-22 DIAGNOSIS — R1013 Epigastric pain: Secondary | ICD-10-CM | POA: Insufficient documentation

## 2013-11-22 LAB — URINALYSIS, ROUTINE W REFLEX MICROSCOPIC
Bilirubin Urine: NEGATIVE
GLUCOSE, UA: NEGATIVE mg/dL
Ketones, ur: NEGATIVE mg/dL
Nitrite: NEGATIVE
PH: 6 (ref 5.0–8.0)
Protein, ur: NEGATIVE mg/dL
SPECIFIC GRAVITY, URINE: 1.029 (ref 1.005–1.030)
Urobilinogen, UA: 0.2 mg/dL (ref 0.0–1.0)

## 2013-11-22 LAB — CBC WITH DIFFERENTIAL/PLATELET
BASOS PCT: 0 % (ref 0–1)
Basophils Absolute: 0 10*3/uL (ref 0.0–0.1)
EOS ABS: 0.3 10*3/uL (ref 0.0–0.7)
EOS PCT: 4 % (ref 0–5)
HEMATOCRIT: 31.5 % — AB (ref 36.0–46.0)
Hemoglobin: 9.4 g/dL — ABNORMAL LOW (ref 12.0–15.0)
LYMPHS PCT: 37 % (ref 12–46)
Lymphs Abs: 3.5 10*3/uL (ref 0.7–4.0)
MCH: 23.2 pg — ABNORMAL LOW (ref 26.0–34.0)
MCHC: 29.8 g/dL — AB (ref 30.0–36.0)
MCV: 77.6 fL — AB (ref 78.0–100.0)
MONO ABS: 0.5 10*3/uL (ref 0.1–1.0)
Monocytes Relative: 6 % (ref 3–12)
Neutro Abs: 5 10*3/uL (ref 1.7–7.7)
Neutrophils Relative %: 53 % (ref 43–77)
PLATELETS: 414 10*3/uL — AB (ref 150–400)
RBC: 4.06 MIL/uL (ref 3.87–5.11)
RDW: 16.9 % — AB (ref 11.5–15.5)
WBC: 9.4 10*3/uL (ref 4.0–10.5)

## 2013-11-22 LAB — COMPREHENSIVE METABOLIC PANEL
ALBUMIN: 3.3 g/dL — AB (ref 3.5–5.2)
ALT: 10 U/L (ref 0–35)
AST: 15 U/L (ref 0–37)
Alkaline Phosphatase: 80 U/L (ref 39–117)
Anion gap: 8 (ref 5–15)
BUN: 8 mg/dL (ref 6–23)
CALCIUM: 9.3 mg/dL (ref 8.4–10.5)
CO2: 27 meq/L (ref 19–32)
CREATININE: 0.91 mg/dL (ref 0.50–1.10)
Chloride: 105 mEq/L (ref 96–112)
GFR calc Af Amer: >90 mL/min (ref >90)
GFR, EST NON AFRICAN AMERICAN: 80 mL/min — AB (ref >90)
Glucose, Bld: 93 mg/dL (ref 70–99)
Potassium: 4 mEq/L (ref 3.7–5.3)
Sodium: 140 mEq/L (ref 137–147)
TOTAL PROTEIN: 7.3 g/dL (ref 6.0–8.3)
Total Bilirubin: 0.4 mg/dL (ref 0.3–1.2)

## 2013-11-22 LAB — URINE MICROSCOPIC-ADD ON

## 2013-11-22 LAB — POC URINE PREG, ED: PREG TEST UR: NEGATIVE

## 2013-11-22 LAB — LIPASE, BLOOD: LIPASE: 30 U/L (ref 11–59)

## 2013-11-22 MED ORDER — IOHEXOL 300 MG/ML  SOLN
100.0000 mL | Freq: Once | INTRAMUSCULAR | Status: AC | PRN
  Administered 2013-11-22: 100 mL via INTRAVENOUS

## 2013-11-22 MED ORDER — PROMETHAZINE HCL 25 MG/ML IJ SOLN
25.0000 mg | Freq: Four times a day (QID) | INTRAMUSCULAR | Status: DC | PRN
  Administered 2013-11-22: 25 mg via INTRAVENOUS
  Filled 2013-11-22: qty 1

## 2013-11-22 MED ORDER — IOHEXOL 300 MG/ML  SOLN
50.0000 mL | Freq: Once | INTRAMUSCULAR | Status: AC | PRN
  Administered 2013-11-22: 50 mL via ORAL

## 2013-11-22 MED ORDER — PROMETHAZINE HCL 25 MG PO TABS: 25.0000 mg | ORAL_TABLET | Freq: Four times a day (QID) | ORAL | Status: DC | PRN

## 2013-11-22 MED ORDER — SODIUM CHLORIDE 0.9 % IV BOLUS (SEPSIS)
1000.0000 mL | Freq: Once | INTRAVENOUS | Status: AC
  Administered 2013-11-22: 1000 mL via INTRAVENOUS

## 2013-11-22 NOTE — ED Provider Notes (Signed)
CSN: [id number]     Arrival date & time 11/22/13  1502 History   First MD Initiated Contact with Patient 11/22/13 1620     Chief Complaint  Patient presents with  . Abdominal Pain     (Consider location/radiation/quality/duration/timing/severity/associated sxs/prior Treatment) HPI Comments: Patient is a 38 year old female with history of asthma who presents to the emergency department today for 2 weeks of persistent abdominal pain.  Her symptoms are associated with nausea, vomiting, diarrhea. Her emesis is non-bilious and nonbloody. She states her diarrhea is watery. She describes the pain as a burning sensation in her epigastric area. She was seen yesterday and diagnosed with a urinary tract infection. She states that she is here today for a second opinion. She is highly concerned that she has colon cancer given family history of early colon cancer. she has never had a colonoscopy in the past. She denies any urinary symptoms, , dysuria, urinary urgency, urinary frequency. She denies any vaginal discharge, vaginal bleeding. She received IV Rocephin in the emergency department yesterday. She did not fill any of her home prescriptions.   The history is provided by the patient. No language interpreter was used.    Past Medical History  Diagnosis Date  . Bronchitis   . Asthma    Past Surgical History  Procedure Laterality Date  . Tubular ligation    . Cholecystectomy    . Tonsillectomy Bilateral 04/19/2013    Procedure: TONSILLECTOMY;  Surgeon: Christia Readingwight Bates, MD;  [facility];  Service: ENT;  Laterality: Bilateral;   No family history on file. History  Substance Use Topics  . Smoking status: Never Smoker   . Smokeless tobacco: Not on file  . Alcohol Use: No   OB History   Grav Para Term Preterm Abortions TAB SAB Ect Mult Living                 Review of Systems  Constitutional: Negative for fever and chills.  Respiratory: Negative for shortness of breath.   Cardiovascular:  Negative for chest pain.  Gastrointestinal: Positive for nausea, vomiting, abdominal pain and diarrhea. Negative for blood in stool.  Genitourinary: Negative for vaginal bleeding and vaginal discharge.  All other systems reviewed and are negative.     Allergies  Shellfish allergy and Asa  Home Medications   Prior to Admission medications   Medication Sig Start Date End Date Taking? Authorizing Provider  cephALEXin (KEFLEX) 500 MG capsule Take 1 capsule (500 mg total) by mouth 4 (four) times daily. 11/21/13   Roxy Horsemanobert Browning, PA-C  ondansetron (ZOFRAN) 4 MG tablet Take 1 tablet (4 mg total) by mouth every 6 (six) hours. 11/21/13   Roxy Horsemanobert Browning, PA-C   BP 142/78  Pulse 73  Temp(Src) 98.9 F (37.2 C) (Oral)  Resp 16  SpO2 100%  LMP 10/29/2013 Physical Exam  Nursing note and vitals reviewed. Constitutional: She is oriented to person, place, and time. She appears well-developed and well-nourished. No distress.  Patient appears comfortable.   HENT:  Head: Normocephalic and atraumatic.  Right Ear: External ear normal.  Left Ear: External ear normal.  Nose: Nose normal.  Mouth/Throat: Oropharynx is clear and moist.  Eyes: Conjunctivae are normal.  Neck: Normal range of motion.  Cardiovascular: Normal rate, regular rhythm and normal heart sounds.   Pulmonary/Chest: Effort normal and breath sounds normal. No stridor. No respiratory distress. She has no wheezes. She has no rales.  Abdominal: Soft. She exhibits no distension. There is tenderness in the epigastric area.  Musculoskeletal: Normal range of motion.  Neurological: She is alert and oriented to person, place, and time. She has normal strength.  Skin: Skin is warm and dry. She is not diaphoretic. No erythema.  Psychiatric: She has a normal mood and affect. Her behavior is normal.    ED Course  Procedures (including critical care time) Labs Review Labs Reviewed  CBC WITH DIFFERENTIAL - Abnormal; Notable for the  following:    Hemoglobin 9.4 (*)    HCT 31.5 (*)    MCV 77.6 (*)    MCH 23.2 (*)    MCHC 29.8 (*)    RDW 16.9 (*)    Platelets 414 (*)    All other components within normal limits  COMPREHENSIVE METABOLIC PANEL - Abnormal; Notable for the following:    Albumin 3.3 (*)    GFR calc non Af Amer 80 (*)    All other components within normal limits  URINALYSIS, ROUTINE W REFLEX MICROSCOPIC - Abnormal; Notable for the following:    Color, Urine AMBER (*)    APPearance CLOUDY (*)    Hgb urine dipstick TRACE (*)    Leukocytes, UA MODERATE (*)    All other components within normal limits  URINE MICROSCOPIC-ADD ON - Abnormal; Notable for the following:    Squamous Epithelial / LPF FEW (*)    All other components within normal limits  LIPASE, BLOOD  POC URINE PREG, ED    Imaging Review Ct Abdomen Pelvis W Contrast  11/22/2013   CLINICAL DATA:  Abdominal pain.  Vomiting  EXAM: CT ABDOMEN AND PELVIS WITH CONTRAST  TECHNIQUE: Multidetector CT imaging of the abdomen and pelvis was performed using the standard protocol following bolus administration of intravenous contrast.  CONTRAST:  OMNIPAQUE IOHEXOL 300 MG/ML SOLN, 50mL OMNIPAQUE IOHEXOL 300 MG/ML SOLN  COMPARISON:  None.  FINDINGS: Lung bases are clear.  No pericardial fluid.  No focal hepatic lesion. Post cholecystectomy. The pancreas, spleen, adrenal glands, and kidneys are normal.  Stomach, small bowel, and cecum are normal. Appendix not identified however there is no pericecal inflammation. Colon rectosigmoid colon are normal.  Abdominal aorta is normal caliber. No retroperitoneal periportal lymphadenopathy.  No free fluid the pelvis. The uterus and ovaries are normal. No bladder is normal. No aggressive osseous lesion.  No ventral or inguinal hernia.  IMPRESSION: No acute findings in the abdomen or pelvis.   Electronically Signed   By: Genevive Bi M.D.   On: 11/22/2013 18:01     EKG Interpretation None      MDM   Final  diagnoses:  UTI (lower urinary tract infection)    Pt has been diagnosed with a UTI. Pt is afebrile, no CVA tenderness, normotensive, and denies N/V. Pt to be dc home with antibiotics and instructions to follow up with PCP if symptoms persist. CT abd/pelvis was done. Discussed normal findings with patient. Additionally discussed this does not rule out colon cancer and she needs a colonoscopy to look for polyps. Patient was given a resource guide to establish care with PCP. Encouraged patient to fill antibiotic rx. Discussed reasons to return to ED immediately. Vital signs stable for discharge. Patient / Family / Caregiver informed of clinical course, understand medical decision-making process, and agree with plan.     Mora Bellman, PA-C 11/24/13 [phone]

## 2013-11-22 NOTE — ED Provider Notes (Signed)
Medical screening examination/treatment/procedure(s) were performed by non-physician practitioner and as supervising physician I was immediately available for consultation/collaboration.   EKG Interpretation None        Richardean Canalavid H Yao, MD 11/22/13 [phone]

## 2013-11-22 NOTE — ED Notes (Signed)
[facility]. Reports abdominal pain x 2 weeks with n/v/d. Pain 7/10 intermittently at the worse times. 2 episodes of vomiting and diarrhea today. Denies blood in vomit or stool. Alert and oriented. NAD

## 2013-11-22 NOTE — Discharge Instructions (Signed)
Urinary Tract Infection °Urinary tract infections (UTIs) can develop anywhere along your urinary tract. Your urinary tract is your body's drainage system for removing wastes and extra water. Your urinary tract includes two kidneys, two ureters, a bladder, and a urethra. Your kidneys are a pair of bean-shaped organs. Each kidney is about the size of your fist. They are located below your ribs, one on each side of your spine. °CAUSES °Infections are caused by microbes, which are microscopic organisms, including fungi, viruses, and bacteria. These organisms are so small that they can only be seen through a microscope. Bacteria are the microbes that most commonly cause UTIs. °SYMPTOMS  °Symptoms of UTIs may vary by age and gender of the patient and by the location of the infection. Symptoms in young women typically include a frequent and intense urge to urinate and a painful, burning feeling in the bladder or urethra during urination. Older women and men are more likely to be tired, shaky, and weak and have muscle aches and abdominal pain. A fever may mean the infection is in your kidneys. Other symptoms of a kidney infection include pain in your back or sides below the ribs, nausea, and vomiting. °DIAGNOSIS °To diagnose a UTI, your caregiver will ask you about your symptoms. Your caregiver also will ask to provide a urine sample. The urine sample will be tested for bacteria and white blood cells. White blood cells are made by your body to help fight infection. °TREATMENT  °Typically, UTIs can be treated with medication. Because most UTIs are caused by a bacterial infection, they usually can be treated with the use of antibiotics. The choice of antibiotic and length of treatment depend on your symptoms and the type of bacteria causing your infection. °HOME CARE INSTRUCTIONS °· If you were prescribed antibiotics, take them exactly as your caregiver instructs you. Finish the medication even if you feel better after you  have only taken some of the medication. °· Drink enough water and fluids to keep your urine clear or pale yellow. °· Avoid caffeine, tea, and carbonated beverages. They tend to irritate your bladder. °· Empty your bladder often. Avoid holding urine for long periods of time. °· Empty your bladder before and after sexual intercourse. °· After a bowel movement, women should cleanse from front to back. Use each tissue only once. °SEEK MEDICAL CARE IF:  °· You have back pain. °· You develop a fever. °· Your symptoms do not begin to resolve within 3 days. °SEEK IMMEDIATE MEDICAL CARE IF:  °· You have severe back pain or lower abdominal pain. °· You develop chills. °· You have nausea or vomiting. °· You have continued burning or discomfort with urination. °MAKE SURE YOU:  °· Understand these instructions. °· Will watch your condition. °· Will get help right away if you are not doing well or get worse. °Document Released: 01/05/2005 Document Revised: 09/27/2011 Document Reviewed: 05/06/2011 °ExitCare® Patient Information ©2015 [employer]. This information is not intended to replace advice given to you by your health care provider. Make sure you discuss any questions you have with your health care provider. ° ° °Emergency Department Resource Guide °1) Find a Doctor and Pay Out of Pocket °Although you won't have to find out who is covered by your insurance plan, it is a good idea to ask around and get recommendations. You will then need to call the office and see if the doctor you have chosen will accept you as a new patient and what types of   options they offer for patients who are self-pay. Some doctors offer discounts or will set up payment plans for their patients who do not have insurance, but you will need to ask so you aren't surprised when you get to your appointment. ° °2) Contact Your Local Health Department °Not all health departments have doctors that can see patients for sick visits, but many do, so it is worth  a call to see if yours does. If you don't know where your local health department is, you can check in your phone book. The CDC also has a tool to help you locate your state's health department, and many state websites also have listings of all of their local health departments. ° °3) Find a Walk-in Clinic °If your illness is not likely to be very severe or complicated, you may want to try a walk in clinic. These are popping up all over the country in pharmacies, drugstores, and shopping centers. They're usually staffed by nurse practitioners or physician assistants that have been trained to treat common illnesses and complaints. They're usually fairly quick and inexpensive. However, if you have serious medical issues or chronic medical problems, these are probably not your best option. ° °No Primary Care Doctor: °- Call [facility] at  832-8000 - they can help you locate a primary care doctor that  accepts your insurance, provides certain services, etc. °- Physician Referral Service- 1-800-533-3463 ° °Chronic Pain Problems: °Organization         Address  Phone   Notes  °[facility]  (336) 297-2271 Patients need to be referred by their primary care doctor.  ° °Medication Assistance: °Organization         Address  Phone   Notes  °[place] [facility] [address] °[place], [place] [address] (336) 641-8030 --Must be a resident of [place] °-- Must have NO insurance coverage whatsoever (no Medicaid/ Medicare, etc.) °-- The pt. MUST have a primary care doctor that directs their care regularly and follows them in the community °  °[facility]  (866) 331-1348   °[employer]  (888) 892-1162   ° °Agencies that provide inexpensive medical care: °Organization         Address  Phone   Notes  °[facility]  (336) 832-8035   °[facility]    (336) 832-7272   °[facility] [address] °[place], [place]  [address] (336) 832-4777   °[facility] [address], °[place] (336) 271-4999   °[facility]    (336) 373-0678   °[facility]    (336) 272-1050   °[facility] ° [address], [place] Phone:  (336) 832-4444, Fax:  (336) 832-4440 Hours of Operation:  9 am - 6 pm, M-F.  Also accepts Medicaid/Medicare and self-pay.  °[facility] ° [address], [place] Phone: (336) 832-3150, Fax: (336) 832-3151. Hours of Operation:  8:30 am - 5:30 pm, M-F.  Also accepts Medicaid and self-pay.  °[facility] [place] [address], [place] Phone: (336) 878-6027   °[facility] [address], [place], [place] (336)723-1848, Ext. 123 Mondays & Thursdays: 7-9 AM.  First 15 patients are seen on a first come, first serve basis. °  ° °Medicaid-accepting [place] Providers: ° °Organization         Address  Phone   Notes  °  [facility] [address], [place] (336) 641-2100 Also accepts self-pay patients.  °[facility] [address], [place] ° (336) 856-9996   °[facility] [address], [place] (336) 288-8857   °[facility] [address] [place] [address], [place] (336) 299-7000   °Veita Bland [address], [place]  ° (336) 373-1557 Only accepts [place] Access Medicaid patients after they have their name applied to their card.  ° °Self-Pay (no insurance) in [place]: ° °Organization         Address  Phone   Notes  °Sickle Cell Patients, [facility] [address], [place] (336) 832-1970   °[facility] [address], [place] (336) 832-4400   °[facility] [place] ° [address], [place] (336) 992-4800   °[facility]/Dr. Osei-Bonsu ° [address] [place] [address], [place] or [address], [place] (336) 841-8500 Phone number for both [place] and [place] locations is the same.  °[facility] [address], [place] (336) 299-0000   °[facility] [address] [place] [address], [place] or [address] (336) 852-7530 °(336) 878-2260   °[facility] [address], [place] (336) 350-1642, phone; (336) 294-5005, fax Sees patients 1st and 3rd Saturday of every month.  Must not qualify for public or private insurance (i.e. Medicaid, Medicare, [place] Health Choice, Veterans' Benefits) • Household income should be no more than 200% of the poverty level •The clinic cannot treat you if you are pregnant or think you are pregnant • Sexually transmitted diseases are not treated at the clinic.  ° ° °Dental Care: °Organization         Address  Phone  Notes  °[facility] [address], [place] (336) 641-6152 Accepts children up to age 21 who are enrolled in Medicaid or [place] Health Choice; pregnant women with a Medicaid card; and children who have applied for Medicaid or [place] Health Choice, but were declined, whose parents can pay a reduced fee at time of service.  °[facility] [place]  [address], [place] (336) 641-7733 Accepts children up to age 21 who are enrolled in Medicaid or [place] Health Choice; pregnant women with a Medicaid card; and children who have applied for Medicaid or [place] Health Choice, but were declined, whose parents can pay a reduced fee at time of service.  °[facility] ° [address],  (336) 641-4533 Patients are seen by appointment only. Walk-ins are not accepted. [facility] will see patients 18 years of age and older. °Monday - Tuesday (8am-5pm) °Most Wednesdays (8:30-5pm) °$30 per visit, cash only  °[facility] ° [address], [place] (336) 641-4533  Patients are seen by appointment only. Walk-ins are not accepted. [facility] will see patients 18 years of age and older. °One Wednesday Evening (Monthly: Volunteer Based).  $30 per visit, cash only  °[facility]  (919) 537-3737 for adults; Children under age 4, call Graduate Pediatric Dentistry at (919) [phone]. Children aged 4-14, please call (919) [phone] to request a pediatric application. ° Dental services are provided in all areas of dental care including fillings, crowns and bridges, complete and partial   dentures, implants, gum treatment, root canals, and extractions. Preventive care is also provided. Treatment is provided to both adults and children. °Patients are selected via a lottery and there is often a waiting list. °  °[facility] [address], °[employer] ° (336) 763-8833 [email or web address] °  °[facility] [address], [place], [place] (336)723-1848, Ext. 123 Second and Fourth Thursday of each month, opens at 6:30 AM; Clinic ends at 9 AM.  Patients are seen on a first-come first-served basis, and a limited number are seen during each clinic.  ° °[facility] ° [address], [place], [place] (336) 723-7904   Eligibility Requirements °You must have lived in [place], [place], or [place] counties for at least the last three months. °  You cannot be eligible for state or federal sponsored healthcare insurance, including Veterans Administration, Medicaid, or Medicare. °  You generally cannot be eligible for healthcare insurance through your employer.  °  How to apply: °Eligibility screenings are held every Tuesday and Wednesday afternoon from 1:00 pm until 4:00 pm. You do not need an appointment for the interview!  °[facility] [address], [place], [place] 336-631-2330   °[facility]  336-342-8273   °[facility]  336-703-3100   °[facility]   336-570-6415   ° °Behavioral Health Resources in the Community: °Intensive Outpatient Programs °Organization         Address  Phone  Notes  °[facility] [address], [place], [place] 336-878-6098   °[facility] [address], [place], [place] 336-832-9800   °[facility] [address], [place], [place] ° 336-882-2125   °[facility] [address],  °[place], [place] 1-800-853-5163 or 336-641-4981   °Substance Abuse Resources °Organization         Address  Phone  Notes  °[facility]  336-882-2125   °[facility]  336-784-9470   °[facility]  336-285-9073   °[facility]  336-845-3988   °Residential & Outpatient Substance Abuse Program  1-800-659-3381   °Psychological Services °Organization         Address  Phone  Notes  °[facility]  336- 832-9600   °[facility]  336- 378-7881   °[facility] [address], [place] 1-800-853-5163 or 336-641-4981   ° °Mobile Crisis Teams °Organization         Address  Phone  Notes  °[facility], Mobile Crisis Care Unit  1-877-626-1772   °Assertive °[facility] ° [address] [place], [place] 336-834-9664   °Sharon DeEsch [address] °[place] 336-554-5454   ° °Self-Help/Support Groups °Organization         Address  Phone             Notes  °[facility] - variety of support groups  336- 373-1402 Call for more information  °Narcotics Anonymous (NA), [facility] [address], °[place]  2 meetings at this location  ° °Residential Treatment Programs °Organization         Address  Phone  Notes  °[facility] [address],    °[place]  1-866-801-8205   °[facility] ° [address], [place],  704-293-8524   °[facility] [address], [place] 336-845-3988 Admissions: 8am-3pm M-F   °  [facility] [address]    °[place], [place] 336-841-1104   °[facility] [address], [place], [place] 336-379-7146   °[facility] [address]  °[place], [place] 336-285-9073   °[facility] - Intensive Outpatient [address], [place], [place] 336-852-3033   °[facility] [address]  °[place], [place] 1-877-615-2722 or 336-784-9470   °[facility] [address] [place], [place] 336-227-7417 Accepts Medicaid  °[facility] [address]  °[place] 1-800-659-3381 Substance Abuse/Addiction Treatment  ° °[place] Behavioral Health Resources °Organization         Address  Phone  Notes  °[facility]  (888) 581-9988   °Julie Brannon, PhD [address] [place], [place]   (336) 349-5553 or (336) 951-0000   °[facility]   [address] °[place], [place] (336) 349-4454   °[facility] [address], [place], [place] (336) 342-8316 Insurance/Medicaid/sponsorship through [facility]  °[facility] [address] [place]                                    [place], [place] (336) 342-8316 Therapy/tele-psych/case  °[facility] [address]  ° [place], [place] (336) 349-2233    °Dr. Arfeen  (336) 349-4544   °[facility] [place]  [employer] [place] [facility] 1) [address], [place] °2) [address], [place] °3)  [address], [place] (336) 349-3220 °(336) 342-7768 ° °(336) 342-8140   °[place] [facility] (336) 342-1394 or (336) 342-3537 (After Hours)    ° ° ° ° °

## 2013-11-22 NOTE — ED Notes (Signed)
Pt reports abdominal pain post eating with nausea/vomiting/diarrhea. Pt reports one episode of vomiting today. Pt denies blood in vomit/stool.

## 2013-11-27 NOTE — ED Provider Notes (Signed)
Medical screening examination/treatment/procedure(s) were performed by non-physician practitioner and as supervising physician I was immediately available for consultation/collaboration.    Linwood DibblesJon Robynn Marcel, MD 11/27/13 [phone]

## 2014-01-10 ENCOUNTER — Emergency Department (HOSPITAL_COMMUNITY): Payer: Medicaid Other

## 2014-01-10 ENCOUNTER — Encounter (HOSPITAL_COMMUNITY): Payer: Self-pay | Admitting: Emergency Medicine

## 2014-01-10 ENCOUNTER — Emergency Department (HOSPITAL_COMMUNITY)
Admission: EM | Admit: 2014-01-10 | Discharge: 2014-01-10 | Disposition: A | Discharge status: Home / Self Care | Payer: Medicaid Other | Attending: Emergency Medicine | Admitting: Emergency Medicine

## 2014-01-10 DIAGNOSIS — R4701 Aphasia: Secondary | ICD-10-CM | POA: Diagnosis present

## 2014-01-10 DIAGNOSIS — J45909 Unspecified asthma, uncomplicated: Secondary | ICD-10-CM | POA: Diagnosis not present

## 2014-01-10 DIAGNOSIS — Z3202 Encounter for pregnancy test, result negative: Secondary | ICD-10-CM | POA: Diagnosis not present

## 2014-01-10 DIAGNOSIS — R479 Unspecified speech disturbances: Secondary | ICD-10-CM | POA: Diagnosis present

## 2014-01-10 DIAGNOSIS — F419 Anxiety disorder, unspecified: Secondary | ICD-10-CM | POA: Insufficient documentation

## 2014-01-10 DIAGNOSIS — F430 Acute stress reaction: Secondary | ICD-10-CM

## 2014-01-10 DIAGNOSIS — F411 Generalized anxiety disorder: Secondary | ICD-10-CM | POA: Diagnosis present

## 2014-01-10 DIAGNOSIS — R4789 Other speech disturbances: Secondary | ICD-10-CM | POA: Diagnosis not present

## 2014-01-10 LAB — RAPID URINE DRUG SCREEN, HOSP PERFORMED
AMPHETAMINES: NOT DETECTED
Barbiturates: NOT DETECTED
Benzodiazepines: NOT DETECTED
Cocaine: NOT DETECTED
OPIATES: NOT DETECTED
Tetrahydrocannabinol: NOT DETECTED

## 2014-01-10 LAB — URINALYSIS, ROUTINE W REFLEX MICROSCOPIC
Bilirubin Urine: NEGATIVE
Glucose, UA: NEGATIVE mg/dL
Ketones, ur: 15 mg/dL — AB
Nitrite: NEGATIVE
PROTEIN: NEGATIVE mg/dL
SPECIFIC GRAVITY, URINE: 1.028 (ref 1.005–1.030)
Urobilinogen, UA: 0.2 mg/dL (ref 0.0–1.0)
pH: 6.5 (ref 5.0–8.0)

## 2014-01-10 LAB — COMPREHENSIVE METABOLIC PANEL
ALK PHOS: 80 U/L (ref 39–117)
ALT: 12 U/L (ref 0–35)
ANION GAP: 11 (ref 5–15)
AST: 19 U/L (ref 0–37)
Albumin: 3.3 g/dL — ABNORMAL LOW (ref 3.5–5.2)
BUN: 11 mg/dL (ref 6–23)
CO2: 23 mEq/L (ref 19–32)
Calcium: 8.7 mg/dL (ref 8.4–10.5)
Chloride: 105 mEq/L (ref 96–112)
Creatinine, Ser: 0.85 mg/dL (ref 0.50–1.10)
GFR calc Af Amer: >90 mL/min (ref >90)
GFR calc non Af Amer: 87 mL/min — ABNORMAL LOW (ref >90)
Glucose, Bld: 105 mg/dL — ABNORMAL HIGH (ref 70–99)
POTASSIUM: 3.6 meq/L — AB (ref 3.7–5.3)
Sodium: 139 mEq/L (ref 137–147)
TOTAL PROTEIN: 7.8 g/dL (ref 6.0–8.3)
Total Bilirubin: 0.5 mg/dL (ref 0.3–1.2)

## 2014-01-10 LAB — URINE MICROSCOPIC-ADD ON

## 2014-01-10 LAB — CBC
HCT: 31 % — ABNORMAL LOW (ref 36.0–46.0)
Hemoglobin: 9.7 g/dL — ABNORMAL LOW (ref 12.0–15.0)
MCH: 23.7 pg — AB (ref 26.0–34.0)
MCHC: 31.3 g/dL (ref 30.0–36.0)
MCV: 75.6 fL — ABNORMAL LOW (ref 78.0–100.0)
Platelets: 493 10*3/uL — ABNORMAL HIGH (ref 150–400)
RBC: 4.1 MIL/uL (ref 3.87–5.11)
RDW: 17 % — AB (ref 11.5–15.5)
WBC: 15.4 10*3/uL — ABNORMAL HIGH (ref 4.0–10.5)

## 2014-01-10 LAB — I-STAT TROPONIN, ED: Troponin i, poc: 0 ng/mL (ref 0.00–0.08)

## 2014-01-10 LAB — CBG MONITORING, ED: GLUCOSE-CAPILLARY: 101 mg/dL — AB (ref 70–99)

## 2014-01-10 LAB — APTT: aPTT: 28 seconds (ref 24–37)

## 2014-01-10 LAB — I-STAT CHEM 8, ED
BUN: 10 mg/dL (ref 6–23)
Calcium, Ion: 1.14 mmol/L (ref 1.12–1.23)
Chloride: 107 mEq/L (ref 96–112)
Creatinine, Ser: 0.9 mg/dL (ref 0.50–1.10)
Glucose, Bld: 107 mg/dL — ABNORMAL HIGH (ref 70–99)
HCT: 32 % — ABNORMAL LOW (ref 36.0–46.0)
Hemoglobin: 10.9 g/dL — ABNORMAL LOW (ref 12.0–15.0)
POTASSIUM: 3.3 meq/L — AB (ref 3.7–5.3)
Sodium: 139 mEq/L (ref 137–147)
TCO2: 23 mmol/L (ref 0–100)

## 2014-01-10 LAB — DIFFERENTIAL
BASOS ABS: 0.1 10*3/uL (ref 0.0–0.1)
BASOS PCT: 0 % (ref 0–1)
EOS ABS: 0.3 10*3/uL (ref 0.0–0.7)
Eosinophils Relative: 2 % (ref 0–5)
Lymphocytes Relative: 25 % (ref 12–46)
Lymphs Abs: 3.9 10*3/uL (ref 0.7–4.0)
MONOS PCT: 5 % (ref 3–12)
Monocytes Absolute: 0.8 10*3/uL (ref 0.1–1.0)
Neutro Abs: 10.4 10*3/uL — ABNORMAL HIGH (ref 1.7–7.7)
Neutrophils Relative %: 68 % (ref 43–77)

## 2014-01-10 LAB — PROTIME-INR
INR: 1.07 (ref 0.00–1.49)
PROTHROMBIN TIME: 13.9 s (ref 11.6–15.2)

## 2014-01-10 LAB — ETHANOL

## 2014-01-10 LAB — POC URINE PREG, ED: PREG TEST UR: NEGATIVE

## 2014-01-10 NOTE — Discharge Instructions (Signed)
°  SEEK IMMEDIATE MEDICAL ATTENTION (Call 911) IF: The original symptoms that brought you in are getting worse, or if you develop any new change in speech, vision, swallowing, or understanding, incoordination, weakness, numbness, tingling, dizziness, fainting, severe headache, chest pain, or other concerns. Some patients who are having a stroke are eligible to receive a medication which may improve their outcome, but the drug usually must be given within three hours from when symptoms first occur. So if you think you might be having stroke symptoms, don't wait, call 911.  You have signs of possible anxiety and/or depression. This is a very common problem.  Be sure to call your caregiver and arrange for follow-up care as suggested by our staff. RETURN IMMEDIATELY IF DEVELOP threat to harm self or others, suicidal or homicidal thoughts, hallucinations or confusion, unable to be cared for at home or uncontrolled behavior, or other concerns.

## 2014-01-10 NOTE — ED Notes (Signed)
PT ambulated to bathroom located in room. Pt denies dizziness or lightheadedness. Steady gait. Family in bathroom with patient.

## 2014-01-10 NOTE — ED Notes (Signed)
Code stroke canceled at this time by Dr. Stewart. 

## 2014-01-10 NOTE — ED Notes (Signed)
CBG 101.  RN notified. 

## 2014-01-10 NOTE — ED Notes (Addendum)
Pt reports at 1500 ago sudden onset of "slurred speech". Pt now noted to have stuttering. PT also reports intermittent "twitching" to right hand. Pt noted to be moving right index finger rapidly. PT denies any weakness, numbness/tingling or vision changes.

## 2014-01-10 NOTE — ED Notes (Signed)
Md Bednar at bedside. Code stroke activated.

## 2014-01-10 NOTE — Progress Notes (Signed)
Code Stroke called on 38 y.o. female with symptoms beginning at 3 pm this afternoon. Per pt she began having a "stuttering" problem with mild headache, intermittent tremor of right hand, and intermittent blurred vision at 3 pm today while talking to family member on the phone. Presently she denies head ache, or blurred vision. NIHSS completed yielding a score of zero. Pt does have a clear stutter when speaking, however she has NO difficulty finding words and identifying pictures. Pt does admit to sever stressors recently in her life, including [place], domestic issues, sister being diagnosed with colon CA recently, and two active children (10 and 15). Her history includes asthma and bronchitis. CT completed prior to my arrival, per report, negative CT. CBG 101. Dr.Stewart to bedside, assessed pt and discussed NIHSS results. Code Stroke canceled per Dr. Roseanne RenoStewart.

## 2014-01-10 NOTE — Consult Note (Signed)
Reason for Consult: New onset speech difficulty.  HPI:                                                                                                                                          Carla Barton is an 38 y.o. female with a history of bronchitis and asthma who came to the emergency room with complaint of new onset speech difficulty starting at 3 PM today. Code stroke was activated. Patient has had problems with speech output. She had no problems with understanding what was being said to her. She had no focal weakness no numbness. She developed a tremor of her right upper extremity which was intermittent. Patient indicated that her speech problem as well as tremor worsened with increasing anxiety, and also admits to being under a large amount of stress over the past 2 weeks. CT scan of her head showed no acute intracranial abnormality. NIH stroke score was 0. Patient's speech pattern was not consistent with expressive aphasia and Code Stroke was canceled.  Past Medical History  Diagnosis Date  . Bronchitis   . Asthma     Past Surgical History  Procedure Laterality Date  . Tubular ligation    . Cholecystectomy    . Tonsillectomy Bilateral 04/19/2013    Procedure: TONSILLECTOMY;  Surgeon: Melida Quitter, MD;  Location: [place];  Service: ENT;  Laterality: Bilateral;    No family history on file.  Social History:  reports that she has never smoked. She does not have any smokeless tobacco history on file. She reports that she does not drink alcohol or use illicit drugs.  Allergies  Allergen Reactions  . Shellfish Allergy Anaphylaxis and Itching  . Asa [Aspirin] Other (See Comments)    "asthma to flare"    MEDICATIONS:                                                                                                                     I have reviewed the patient's current medications.   ROS:  History obtained from the patient  General ROS: negative for - chills, fatigue, fever, night sweats, weight gain or weight loss Psychological ROS: As noted in present illness with recent situational stress Ophthalmic ROS: negative for - blurry vision, double vision, eye pain or loss of vision ENT ROS: negative for - epistaxis, nasal discharge, oral lesions, sore throat, tinnitus or vertigo Allergy and Immunology ROS: negative for - hives or itchy/watery eyes Hematological and Lymphatic ROS: negative for - bleeding problems, bruising or swollen lymph nodes Endocrine ROS: negative for - galactorrhea, hair pattern changes, polydipsia/polyuria or temperature intolerance Respiratory ROS: negative for - cough, hemoptysis, shortness of breath or wheezing Cardiovascular ROS: negative for - chest pain, dyspnea on exertion, edema or irregular heartbeat Gastrointestinal ROS: negative for - abdominal pain, diarrhea, hematemesis, nausea/vomiting or stool incontinence Genito-Urinary ROS: negative for - dysuria, hematuria, incontinence or urinary frequency/urgency Musculoskeletal ROS: negative for - joint swelling or muscular weakness Neurological ROS: as noted in HPI Dermatological ROS: negative for rash and skin lesion changes   Blood pressure 127/85, pulse 92, temperature 99.4 F (37.4 C), temperature source Oral, resp. rate 16, SpO2 100.00%.   Neurologic Examination:                                                                                                      Mental Status: Alert, oriented, moderately anxious.  Stuttering-like speech pattern with no signs of expressive aphasia. Able to follow commands without difficulty. Cranial Nerves: II-Visual fields were normal. III/IV/VI-Pupils were equal and reacted. Extraocular movements were full and conjugate.    V/VII-no facial numbness and no facial weakness. VIII-normal. X-normal speech and symmetrical  palatal movement. XII-midline tongue extension Motor: 5/5 bilaterally with normal tone and bulk Sensory: Normal throughout. Deep Tendon Reflexes: 2+ and symmetric. Plantars: Flexor bilaterally Cerebellar: Normal finger-to-nose testing.  No results found for this basename: cbc, bmp, coags, chol, tri, ldl, hga1c    Results for orders placed during the hospital encounter of 01/10/14 (from the past 48 hour(s))  PROTIME-INR     Status: None   Collection Time    01/10/14  7:50 PM      Result Value Ref Range   Prothrombin Time 13.9  11.6 - 15.2 seconds   INR 1.07  0.00 - 1.49  APTT     Status: None   Collection Time    01/10/14  7:50 PM      Result Value Ref Range   aPTT 28  24 - 37 seconds  CBC     Status: Abnormal   Collection Time    01/10/14  7:50 PM      Result Value Ref Range   WBC 15.4 (*) 4.0 - 10.5 K/uL   RBC 4.10  3.87 - 5.11 MIL/uL   Hemoglobin 9.7 (*) 12.0 - 15.0 g/dL   HCT 31.0 (*) 36.0 - 46.0 %   MCV 75.6 (*) 78.0 - 100.0 fL   MCH 23.7 (*) 26.0 - 34.0 pg   MCHC 31.3  30.0 - 36.0 g/dL   RDW 17.0 (*) 11.5 - 15.5 %  Platelets 493 (*) 150 - 400 K/uL  DIFFERENTIAL     Status: Abnormal   Collection Time    01/10/14  7:50 PM      Result Value Ref Range   Neutrophils Relative % 68  43 - 77 %   Neutro Abs 10.4 (*) 1.7 - 7.7 K/uL   Lymphocytes Relative 25  12 - 46 %   Lymphs Abs 3.9  0.7 - 4.0 K/uL   Monocytes Relative 5  3 - 12 %   Monocytes Absolute 0.8  0.1 - 1.0 K/uL   Eosinophils Relative 2  0 - 5 %   Eosinophils Absolute 0.3  0.0 - 0.7 K/uL   Basophils Relative 0  0 - 1 %   Basophils Absolute 0.1  0.0 - 0.1 K/uL  COMPREHENSIVE METABOLIC PANEL     Status: Abnormal   Collection Time    01/10/14  7:50 PM      Result Value Ref Range   Sodium 139  137 - 147 mEq/L   Potassium 3.6 (*) 3.7 - 5.3 mEq/L   Chloride 105  96 - 112 mEq/L   CO2 23  19 - 32 mEq/L   Glucose, Bld 105 (*) 70 - 99 mg/dL   BUN 11  6 - 23 mg/dL   Creatinine, Ser 0.85  0.50 - 1.10 mg/dL    Calcium 8.7  8.4 - 10.5 mg/dL   Total Protein 7.8  6.0 - 8.3 g/dL   Albumin 3.3 (*) 3.5 - 5.2 g/dL   AST 19  0 - 37 U/L   ALT 12  0 - 35 U/L   Alkaline Phosphatase 80  39 - 117 U/L   Total Bilirubin 0.5  0.3 - 1.2 mg/dL   GFR calc non Af Amer 87 (*) >90 mL/min   GFR calc Af Amer >90  >90 mL/min   Comment: (NOTE)     The eGFR has been calculated using the CKD EPI equation.     This calculation has not been validated in all clinical situations.     eGFR's persistently <90 mL/min signify possible Chronic Kidney     Disease.   Anion gap 11  5 - 15  ETHANOL     Status: None   Collection Time    01/10/14  7:50 PM      Result Value Ref Range   Alcohol, Ethyl (B) <11  0 - 11 mg/dL   Comment:            LOWEST DETECTABLE LIMIT FOR     SERUM ALCOHOL IS 11 mg/dL     FOR MEDICAL PURPOSES ONLY  I-STAT TROPOININ, ED     Status: None   Collection Time    01/10/14  8:12 PM      Result Value Ref Range   Troponin i, poc 0.00  0.00 - 0.08 ng/mL   Comment 3            Comment: Due to the release kinetics of cTnI,     a negative result within the first hours     of the onset of symptoms does not rule out     myocardial infarction with certainty.     If myocardial infarction is still suspected,     repeat the test at appropriate intervals.  I-STAT CHEM 8, ED     Status: Abnormal   Collection Time    01/10/14  8:14 PM      Result Value Ref Range  Sodium 139  137 - 147 mEq/L   Potassium 3.3 (*) 3.7 - 5.3 mEq/L   Chloride 107  96 - 112 mEq/L   BUN 10  6 - 23 mg/dL   Creatinine, Ser 0.90  0.50 - 1.10 mg/dL   Glucose, Bld 107 (*) 70 - 99 mg/dL   Calcium, Ion 1.14  1.12 - 1.23 mmol/L   TCO2 23  0 - 100 mmol/L   Hemoglobin 10.9 (*) 12.0 - 15.0 g/dL   HCT 32.0 (*) 36.0 - 46.0 %    Ct Head (brain) Wo Contrast  01/10/2014   CLINICAL DATA:  Aphasia, right hand shaking, code stroke. Initial encounter.  EXAM: CT HEAD WITHOUT CONTRAST  TECHNIQUE: Contiguous axial images were obtained from the base  of the skull through the vertex without intravenous contrast.  COMPARISON:  Neck CT - 04/06/2013  FINDINGS: Gray-white differentiation is maintained. No CT evidence of acute large territory infarct. No intraparenchymal extra-axial mass or hemorrhage. Normal size and configuration of the ventricles and basilar cisterns though note is made of a cavum vergae. No midline shift. Limited visualization of the paranasal sinuses and mastoid air cells is normal. Regional soft tissues appear normal. No displaced calvarial fracture.  IMPRESSION: Negative noncontrast head CT.  Critical Value/emergent results were called by telephone at the time of interpretation on 01/10/2014 at 8:11 pm to Dr. Riki Altes , who verbally acknowledged these results.   Electronically Signed   By: Sandi Mariscal M.D.   On: 01/10/2014 20:22     Assessment/Plan: 38 year old lady presenting with new onset speech pattern without features of expressive or receptive aphasia. Presenting symptoms appear to be psychophysiologic in etiology, likely related to situational stresses, with acute anxiety.  Recommendations: 1. No further neurodiagnostic studies are indicated. 2. No further clinical neurological intervention is indicated, as well. 3. Consider anxiety lytic agent for management of acute anxiety.  C.R. Nicole Kindred, MD [facility] [phone]  01/10/2014, 8:53 PM

## 2014-01-10 NOTE — ED Provider Notes (Signed)
CSN: [id number]     Arrival date & time 01/10/14  1928 History   First MD Initiated Contact with Patient 01/10/14 1950     Chief Complaint  Patient presents with  . Aphasia  . Shaking   Initial Neuro exam performed in ED Triage by myself prior to Code Stroke activation at 1945.  (Consider location/radiation/quality/duration/timing/severity/associated sxs/prior Treatment) HPI 38 year old female states over the last couple weeks she's been under a lot of stress a lot of anxiety with family issues at home but she states she is safe at home there is no domestic violence she is not suicidal she is not homicidal she is not hallucinating she's had no fever no trauma no chest pain no cough no shortness of breath and she was last known well today at 3:00 in the afternoon when she noticed she started having difficulty speaking with some stuttering to her words and some word finding hesitancy with slightly slurred speech intermittently but no difficulty swallowing no aphasia and initially no headache and she gradually developed a mild headache that came and went in about an hour which is now gone she is no neck pain no lateralizing or focal weakness or numbness or incoordination just some intermittent trembling and tremors to her right hand at rest but not with activity. She has had the speech abnormality for the last several hours and it has not resolved by itself so she came to the ED for evaluation. The patient herself believes this is an anxiety episode and that her speech difficulties are likely attributed to the stress that she has been feeling recently. Past Medical History  Diagnosis Date  . Bronchitis   . Asthma    Past Surgical History  Procedure Laterality Date  . Tubular ligation    . Cholecystectomy    . Tonsillectomy Bilateral 04/19/2013    Procedure: TONSILLECTOMY;  Surgeon: Christia Reading, MD;  Location: [facility] OR;  Service: ENT;  Laterality: Bilateral;   No family history on file. History   Substance Use Topics  . Smoking status: Never Smoker   . Smokeless tobacco: Not on file  . Alcohol Use: No   OB History   Grav Para Term Preterm Abortions TAB SAB Ect Mult Living                 Review of Systems 10 Systems reviewed and are negative for acute change except as noted in the HPI.   Allergies  Shellfish allergy and Asa  Home Medications   Prior to Admission medications   Medication Sig Start Date End Date Taking? Authorizing Provider  promethazine (PHENERGAN) 25 MG tablet Take 1 tablet (25 mg total) by mouth every 6 (six) hours as needed for nausea or vomiting. 11/22/13  Yes Mora Bellman, PA-C   BP 110/69  Pulse 85  Temp(Src) 97.9 F (36.6 C) (Oral)  Resp 14  SpO2 100% Physical Exam  Nursing note and vitals reviewed. Constitutional:  Awake, alert, nontoxic appearance with baseline speech for patient.  HENT:  Head: Atraumatic.  Mouth/Throat: No oropharyngeal exudate.  Eyes: EOM are normal. Pupils are equal, round, and reactive to light. Right eye exhibits no discharge. Left eye exhibits no discharge.  Neck: Neck supple.  Cardiovascular: Normal rate and regular rhythm.   No murmur heard. Pulmonary/Chest: Effort normal and breath sounds normal. No stridor. No respiratory distress. She has no wheezes. She has no rales. She exhibits no tenderness.  Abdominal: Soft. Bowel sounds are normal. She exhibits no mass. There is  no tenderness. There is no rebound.  Musculoskeletal: She exhibits no tenderness.  Baseline ROM, moves extremities with no obvious new focal weakness.  Lymphadenopathy:    She has no cervical adenopathy.  Neurological: She is alert.  Awake, alert, cooperative and aware of situation; motor strength bilaterally; sensation normal to light touch bilaterally; peripheral visual fields full to confrontation; no facial asymmetry; tongue midline; major cranial nerves appear intact; no pronator drift, normal finger to nose bilaterally, baseline gait  without new ataxia. Speech is slightly hesitant and stuttering at times but no obvious dysarthria and no aphasia.  Skin: No rash noted.  Psychiatric: She has a normal mood and affect.    ED Course  Procedures (including critical care time) Code Stroke activated since within 8 hours of speech change, but suspect unlikely to be CVA when seen in Triage.  Speech much better almost back to normal; Pt seen by neurology in the ED who also doubts stroke or TIA and recommends no MRI be performed; the patient may be discharged. Pt has no PCP so will refer for OutPt f/u at Neuro although I doubt further outpatient neurologic evaluation will be necessary. 2310  Labs Review Labs Reviewed  CBC - Abnormal; Notable for the following:    WBC 15.4 (*)    Hemoglobin 9.7 (*)    HCT 31.0 (*)    MCV 75.6 (*)    MCH 23.7 (*)    RDW 17.0 (*)    Platelets 493 (*)    All other components within normal limits  DIFFERENTIAL - Abnormal; Notable for the following:    Neutro Abs 10.4 (*)    All other components within normal limits  COMPREHENSIVE METABOLIC PANEL - Abnormal; Notable for the following:    Potassium 3.6 (*)    Glucose, Bld 105 (*)    Albumin 3.3 (*)    GFR calc non Af Amer 87 (*)    All other components within normal limits  URINALYSIS, ROUTINE W REFLEX MICROSCOPIC - Abnormal; Notable for the following:    APPearance CLOUDY (*)    Hgb urine dipstick SMALL (*)    Ketones, ur 15 (*)    Leukocytes, UA SMALL (*)    All other components within normal limits  URINE MICROSCOPIC-ADD ON - Abnormal; Notable for the following:    Squamous Epithelial / LPF FEW (*)    Bacteria, UA MANY (*)    All other components within normal limits  CBG MONITORING, ED - Abnormal; Notable for the following:    Glucose-Capillary 101 (*)    All other components within normal limits  I-STAT CHEM 8, ED - Abnormal; Notable for the following:    Potassium 3.3 (*)    Glucose, Bld 107 (*)    Hemoglobin 10.9 (*)    HCT  32.0 (*)    All other components within normal limits  PROTIME-INR  APTT  ETHANOL  URINE RAPID DRUG SCREEN (HOSP PERFORMED)  I-STAT TROPOININ, ED  I-STAT CHEM 8, ED  I-STAT TROPOININ, ED  I-STAT TROPOININ, ED  POC URINE PREG, ED    Imaging Review No results found.   EKG Interpretation   Date/Time:  Friday January 10 2014 19:35:25 EDT Ventricular Rate:  119 PR Interval:  132 QRS Duration: 76 QT Interval:  312 QTC Calculation: 438 R Axis:   76 Text Interpretation:  Sinus tachycardia Cannot rule out Anterior infarct ,  age undetermined No previous ECGs available Confirmed by [facility]  MD, Jonny RuizJOHN  [phone]([id number]) on 01/10/2014 7:57:51  PM      MDM   Final diagnoses:  Speech abnormality  Anxiety in acute stress reaction    Patient / Family / Caregiver informed of clinical course, understand medical decision-making process, and agree with plan.  I doubt any other EMC precluding discharge at this time including, but not necessarily limited to the following:TIA/CVA/SAH.    Hurman Horn, MD 01/13/14 Rickey Primus

## 2014-01-25 ENCOUNTER — Emergency Department (HOSPITAL_COMMUNITY)
Admission: EM | Admit: 2014-01-25 | Discharge: 2014-01-25 | Disposition: A | Discharge status: Home / Self Care | Payer: Medicaid Other | Source: Home / Self Care | Attending: Emergency Medicine | Admitting: Emergency Medicine

## 2014-01-25 ENCOUNTER — Encounter (HOSPITAL_COMMUNITY): Payer: Self-pay | Admitting: Emergency Medicine

## 2014-01-25 DIAGNOSIS — S39012A Strain of muscle, fascia and tendon of lower back, initial encounter: Secondary | ICD-10-CM

## 2014-01-25 DIAGNOSIS — Y92009 Unspecified place in unspecified non-institutional (private) residence as the place of occurrence of the external cause: Secondary | ICD-10-CM

## 2014-01-25 MED ORDER — CYCLOBENZAPRINE HCL 5 MG PO TABS: 5.0000 mg | ORAL_TABLET | Freq: Three times a day (TID) | ORAL | Status: DC | PRN

## 2014-01-25 MED ORDER — HYDROCODONE-ACETAMINOPHEN 5-325 MG PO TABS
ORAL_TABLET | ORAL | Status: DC
Start: 2014-01-25 — End: 2014-06-25

## 2014-01-25 MED ORDER — IBUPROFEN 800 MG PO TABS: 800.0000 mg | ORAL_TABLET | Freq: Three times a day (TID) | ORAL | Status: DC

## 2014-01-25 NOTE — ED Provider Notes (Signed)
Chief Complaint   Back Pain   History of Present Illness   Carla Barton is a 38 year old female who was lifting a heavy [place] cabinet for her mother last night. After lifting she felt pain in her right mid lumbar area without radiation. It hurts to bend, to lift, and twist. She denies any radiation down her leg, numbness, tingling, or muscle weakness. She has had no bladder or bowel dysfunction or saddle anesthesia. She denies any abdominal pain, fever, chills, or weight loss. She strained her back once several years ago. She's not had any other chronic back problems.  Review of Systems   Other than as noted above, the patient denies any of the following symptoms: Systemic:  No fever, chills, or unexplained weight loss. GI:  No abdominal pain or incontinence of bowel. GU:  No dysuria, frequency, urgency, or hematuria. No incontinence of urine or urinary retention.  M-S:  No neck pain or arthritis. Neuro:  No paresthesias, headache, saddle anesthesia, muscular weakness, or progressive neurological deficit.  PMFSH   Past medical history, family history, social history, meds, and allergies were reviewed. Specifically, there is no history of cancer, major trauma, osteoporosis, immunosuppression, or HIV infection. She has asthma and is sensitive to aspirin, however she can take other nonsteroidal anti-inflammatory drugs.  Physical Examination    Vital signs:  BP 127/81  Pulse 68  Temp(Src) 98.2 F (36.8 C) (Oral)  Resp 16  SpO2 100%  LMP 01/13/2014 General:  Alert, oriented, in no distress. Abdomen:  Soft, non-tender.  No organomegaly or mass.  No pulsatile midline abdominal mass or bruit. Back:  There was pain to palpation in the right, mid lumbar area. She has a limited range of motion with 30 of flexion, 10 extension, 10 of lateral bending to the right, 30 lateral bending to the left, 85 of rotation to each direction with pain on movement. Straight leg raising was  negative. Neuro:  Normal muscle strength, sensations and DTRs. Extremities: Pedal pulses were full, there was no edema. Skin:  Clear, warm and dry.  No rash.   Assessment   The primary encounter diagnosis was Lumbar strain, initial encounter. A diagnosis of Place of occurrence, home was also pertinent to this visit.  No evidence of cauda equina syndrome, discitis, epidural abscess, fracture, acute pyelonephritis, bleed, cancer, or aneurism.    Plan     1.  Meds:  The following meds were prescribed:   New Prescriptions   CYCLOBENZAPRINE (FLEXERIL) 5 MG TABLET    Take 1 tablet (5 mg total) by mouth 3 (three) times daily as needed for muscle spasms.   HYDROCODONE-ACETAMINOPHEN (NORCO/VICODIN) 5-325 MG PER TABLET    1 to 2 tabs every 4 to 6 hours as needed for pain.   IBUPROFEN (ADVIL,MOTRIN) 800 MG TABLET    Take 1 tablet (800 mg total) by mouth 3 (three) times daily.    2.  Patient Education/Counseling:  The patient was given appropriate handouts, self care instructions, and instructed in symptomatic relief. The patient was encouraged to try to be as active as possible and given some exercises to do followed by moist heat.   3.  Follow up:  The patient was told to follow up here if no better in 3 to 4 days, or sooner if becoming worse in any way, and given some red flag symptoms such as worsening pain or new neurological symptoms which would prompt immediate return.  Follow up here if no better in 2 weeks.  Reuben Likesavid C Jahleah Mariscal, MD 01/25/14 1030

## 2014-01-25 NOTE — ED Notes (Signed)
Pt states that she was moving furniture with family yesterday 01/24/2014. Pt states pain started last night. Pt states pain is 8 out of 10 no acute distress noted

## 2014-01-25 NOTE — Discharge Instructions (Signed)
Do exercises twice daily followed by moist heat for 15 minutes. ° ° ° ° ° °Try to be as active as possible. ° °If no better in 2 weeks, follow up with orthopedist. ° ° °

## 2014-06-25 ENCOUNTER — Emergency Department (HOSPITAL_COMMUNITY)
Admission: EM | Admit: 2014-06-25 | Discharge: 2014-06-25 | Disposition: A | Discharge status: Home / Self Care | Payer: Medicaid Other | Source: Home / Self Care | Attending: Family Medicine | Admitting: Family Medicine

## 2014-06-25 ENCOUNTER — Encounter (HOSPITAL_COMMUNITY): Payer: Self-pay | Admitting: Emergency Medicine

## 2014-06-25 DIAGNOSIS — J0100 Acute maxillary sinusitis, unspecified: Secondary | ICD-10-CM

## 2014-06-25 MED ORDER — CEFDINIR 300 MG PO CAPS: 300.0000 mg | ORAL_CAPSULE | Freq: Two times a day (BID) | ORAL | Status: DC

## 2014-06-25 MED ORDER — PREDNISONE 10 MG PO TABS
30.0000 mg | ORAL_TABLET | Freq: Every day | ORAL | Status: DC
Start: 2014-06-25 — End: 2014-07-03

## 2014-06-25 MED ORDER — IPRATROPIUM BROMIDE 0.06 % NA SOLN: 2.0000 | Freq: Four times a day (QID) | NASAL | Status: DC

## 2014-06-25 NOTE — ED Provider Notes (Signed)
Carla Barton is a 39 y.o. female who presents to Urgent Care today for right-sided sinus pain and pressure for 3 days associated with headache and cough. No vomiting diarrhea chest pain or palpitations. Patient has tried Sudafed and Benadryl which did not help. Her symptoms are consistent with previous episodes of sinus infections.   Past Medical History  Diagnosis Date  . Bronchitis   . Asthma    Past Surgical History  Procedure Laterality Date  . Tubular ligation    . Cholecystectomy    . Tonsillectomy Bilateral 04/19/2013    Procedure: TONSILLECTOMY;  Surgeon: Christia Readingwight Bates, MD;  [facility];  Service: ENT;  Laterality: Bilateral;  . Tubal ligation     History  Substance Use Topics  . Smoking status: Never Smoker   . Smokeless tobacco: Never Used  . Alcohol Use: No   ROS as above Medications: No current facility-administered medications for this encounter.   Current Outpatient Prescriptions  Medication Sig Dispense Refill  . cefdinir (OMNICEF) 300 MG capsule Take 1 capsule (300 mg total) by mouth 2 (two) times daily. 14 capsule 0  . ibuprofen (ADVIL,MOTRIN) 800 MG tablet Take 1 tablet (800 mg total) by mouth 3 (three) times daily. 21 tablet 0  . ipratropium (ATROVENT) 0.06 % nasal spray Place 2 sprays into both nostrils 4 (four) times daily. 15 mL 1  . predniSONE (DELTASONE) 10 MG tablet Take 3 tablets (30 mg total) by mouth daily. 15 tablet 0  . [DISCONTINUED] promethazine (PHENERGAN) 25 MG tablet Take 1 tablet (25 mg total) by mouth every 6 (six) hours as needed for nausea or vomiting. 12 tablet 0   Allergies  Allergen Reactions  . Shellfish Allergy Anaphylaxis and Itching  . Asa [Aspirin] Other (See Comments)    "asthma to flare"     Exam:  BP 117/79 mmHg  Pulse 76  Temp(Src) 98.6 F (37 C) (Oral)  Resp 20  SpO2 100% Gen: Well NAD HEENT: EOMI,  MMM tender to palpation right maxillary sinus. Left is normal. Inflamed nasal turbinates bilaterally. Clear nasal  discharge. Normal posterior pharynx and tympanic membranes. Lungs: Normal work of breathing. CTABL Heart: RRR no MRG Abd: NABS, Soft. Nondistended, Nontender Exts: Brisk capillary refill, warm and well perfused.   No results found for this or any previous visit (from the past 24 hour(s)). No results found.  Assessment and Plan: 39 y.o. female with sinusitis. Treat with prednisone and Omnicef and Atrovent nasal spray.  Discussed warning signs or symptoms. Please see discharge instructions. Patient expresses understanding.     Rodolph BongEvan S Ruthia Person, MD 06/25/14 [phone]

## 2014-06-25 NOTE — ED Notes (Signed)
C/o cold sx onset 3 days Sx include facial pressure, HA, dry cough, runny nose, congestion Denies fevers, chills Taking Sudafed w/no relief.  Alert, no signs of acute distress

## 2014-06-25 NOTE — Discharge Instructions (Signed)
Thank you for coming in today.  Sinusitis Sinusitis is redness, soreness, and inflammation of the paranasal sinuses. Paranasal sinuses are air pockets within the bones of your face (beneath the eyes, the middle of the forehead, or above the eyes). In healthy paranasal sinuses, mucus is able to drain out, and air is able to circulate through them by way of your nose. However, when your paranasal sinuses are inflamed, mucus and air can become trapped. This can allow bacteria and other germs to grow and cause infection. Sinusitis can develop quickly and last only a short time (acute) or continue over a long period (chronic). Sinusitis that lasts for more than 12 weeks is considered chronic.  CAUSES  Causes of sinusitis include:  Allergies.  Structural abnormalities, such as displacement of the cartilage that separates your nostrils (deviated septum), which can decrease the air flow through your nose and sinuses and affect sinus drainage.  Functional abnormalities, such as when the small hairs (cilia) that line your sinuses and help remove mucus do not work properly or are not present. SIGNS AND SYMPTOMS  Symptoms of acute and chronic sinusitis are the same. The primary symptoms are pain and pressure around the affected sinuses. Other symptoms include:  Upper toothache.  Earache.  Headache.  Bad breath.  Decreased sense of smell and taste.  A cough, which worsens when you are lying flat.  Fatigue.  Fever.  Thick drainage from your nose, which often is green and may contain pus (purulent).  Swelling and warmth over the affected sinuses. DIAGNOSIS  Your health care provider will perform a physical exam. During the exam, your health care provider may:  Look in your nose for signs of abnormal growths in your nostrils (nasal polyps).  Tap over the affected sinus to check for signs of infection.  View the inside of your sinuses (endoscopy) using an imaging device that has a light  attached (endoscope). If your health care provider suspects that you have chronic sinusitis, one or more of the following tests may be recommended:  Allergy tests.  Nasal culture. A sample of mucus is taken from your nose, sent to a lab, and screened for bacteria.  Nasal cytology. A sample of mucus is taken from your nose and examined by your health care provider to determine if your sinusitis is related to an allergy. TREATMENT  Most cases of acute sinusitis are related to a viral infection and will resolve on their own within 10 days. Sometimes medicines are prescribed to help relieve symptoms (pain medicine, decongestants, nasal steroid sprays, or saline sprays).  However, for sinusitis related to a bacterial infection, your health care provider will prescribe antibiotic medicines. These are medicines that will help kill the bacteria causing the infection.  Rarely, sinusitis is caused by a fungal infection. In theses cases, your health care provider will prescribe antifungal medicine. For some cases of chronic sinusitis, surgery is needed. Generally, these are cases in which sinusitis recurs more than 3 times per year, despite other treatments. HOME CARE INSTRUCTIONS   Drink plenty of water. Water helps thin the mucus so your sinuses can drain more easily.  Use a humidifier.  Inhale steam 3 to 4 times a day (for example, sit in the bathroom with the shower running).  Apply a warm, moist washcloth to your face 3 to 4 times a day, or as directed by your health care provider.  Use saline nasal sprays to help moisten and clean your sinuses.  Take medicines only as  directed by your health care provider.  If you were prescribed either an antibiotic or antifungal medicine, finish it all even if you start to feel better. SEEK IMMEDIATE MEDICAL CARE IF:  You have increasing pain or severe headaches.  You have nausea, vomiting, or drowsiness.  You have swelling around your face.  You  have vision problems.  You have a stiff neck.  You have difficulty breathing. MAKE SURE YOU:   Understand these instructions.  Will watch your condition.  Will get help right away if you are not doing well or get worse. Document Released: 03/28/2005 Document Revised: 08/12/2013 Document Reviewed: 04/12/2011 [facility] Patient Information 2015 [place], [place]. This information is not intended to replace advice given to you by your health care provider. Make sure you discuss any questions you have with your health care provider.

## 2014-07-03 ENCOUNTER — Emergency Department (HOSPITAL_COMMUNITY)
Admission: EM | Admit: 2014-07-03 | Discharge: 2014-07-03 | Disposition: A | Discharge status: Home / Self Care | Payer: Medicaid Other | Attending: Emergency Medicine | Admitting: Emergency Medicine

## 2014-07-03 ENCOUNTER — Encounter (HOSPITAL_COMMUNITY): Payer: Self-pay | Admitting: Emergency Medicine

## 2014-07-03 ENCOUNTER — Emergency Department (HOSPITAL_COMMUNITY): Payer: Medicaid Other

## 2014-07-03 DIAGNOSIS — Z792 Long term (current) use of antibiotics: Secondary | ICD-10-CM | POA: Insufficient documentation

## 2014-07-03 DIAGNOSIS — Z7952 Long term (current) use of systemic steroids: Secondary | ICD-10-CM | POA: Insufficient documentation

## 2014-07-03 DIAGNOSIS — R0602 Shortness of breath: Secondary | ICD-10-CM | POA: Diagnosis present

## 2014-07-03 DIAGNOSIS — Z791 Long term (current) use of non-steroidal anti-inflammatories (NSAID): Secondary | ICD-10-CM | POA: Diagnosis not present

## 2014-07-03 DIAGNOSIS — Z79899 Other long term (current) drug therapy: Secondary | ICD-10-CM | POA: Diagnosis not present

## 2014-07-03 DIAGNOSIS — J45901 Unspecified asthma with (acute) exacerbation: Secondary | ICD-10-CM | POA: Insufficient documentation

## 2014-07-03 DIAGNOSIS — R000 Tachycardia, unspecified: Secondary | ICD-10-CM | POA: Insufficient documentation

## 2014-07-03 MED ORDER — HYDROCOD POLST-CHLORPHEN POLST 10-8 MG/5ML PO LQCR
5.0000 mL | Freq: Two times a day (BID) | ORAL | Status: DC | PRN
Start: 2014-07-03 — End: 2017-06-29

## 2014-07-03 MED ORDER — PREDNISONE 20 MG PO TABS: ORAL_TABLET | ORAL | Status: DC

## 2014-07-03 MED ORDER — PREDNISONE 20 MG PO TABS
60.0000 mg | ORAL_TABLET | Freq: Once | ORAL | Status: AC
  Administered 2014-07-03: 60 mg via ORAL
  Filled 2014-07-03: qty 3

## 2014-07-03 MED ORDER — ALBUTEROL SULFATE (2.5 MG/3ML) 0.083% IN NEBU
5.0000 mg | INHALATION_SOLUTION | Freq: Once | RESPIRATORY_TRACT | Status: AC
  Administered 2014-07-03: 5 mg via RESPIRATORY_TRACT
  Filled 2014-07-03: qty 6

## 2014-07-03 MED ORDER — ALBUTEROL SULFATE HFA 108 (90 BASE) MCG/ACT IN AERS
2.0000 | INHALATION_SPRAY | Freq: Once | RESPIRATORY_TRACT | Status: AC
  Administered 2014-07-03: 2 via RESPIRATORY_TRACT
  Filled 2014-07-03: qty 6.7

## 2014-07-03 MED ORDER — IPRATROPIUM-ALBUTEROL 0.5-2.5 (3) MG/3ML IN SOLN
3.0000 mL | Freq: Once | RESPIRATORY_TRACT | Status: AC
  Administered 2014-07-03: 3 mL via RESPIRATORY_TRACT
  Filled 2014-07-03: qty 3

## 2014-07-03 MED ORDER — HYDROCOD POLST-CHLORPHEN POLST 10-8 MG/5ML PO LQCR
5.0000 mL | Freq: Once | ORAL | Status: AC
  Administered 2014-07-03: 5 mL via ORAL
  Filled 2014-07-03: qty 5

## 2014-07-03 NOTE — ED Provider Notes (Signed)
CSN: [id number]     Arrival date & time 07/03/14  1144 History  This chart was scribed for non-physician practitioner Trixie Dredge working with Azalia Bilis, MD by Carl Best, ED Scribe. This patient was seen in room WTR6/WTR6 and the patient's care was started at 12:23 PM.    Chief Complaint  Patient presents with  . Shortness of Breath  . Chest Pain    tightness in chest with cough  . Headache    Patient is a 39 y.o. female presenting with shortness of breath, chest pain, and headaches. The history is provided by the patient. No language interpreter was used.  Shortness of Breath Associated symptoms: chest pain, cough, headaches and wheezing   Associated symptoms: no abdominal pain, no fever, no sore throat and no vomiting   Chest Pain Associated symptoms: cough, headache and shortness of breath   Associated symptoms: no abdominal pain, no fever, no nausea and not vomiting   Headache Associated symptoms: cough   Associated symptoms: no abdominal pain, no congestion, no diarrhea, no fever, no nausea, no sore throat and no vomiting    HPI Comments: Carla Barton is a 39 y.o. female with a history of asthma who presents to the Emergency Department complaining of constant SOB, nonproductive cough, and myalgias that started yesterday.  She normally uses an albuterol inhaler to alleviate her symptoms but does not have any more medication at home.  She endorses sick contacts with cough.  She lists chest tightness, chills, and wheezing as associated symptoms.  She denies sore throat, rhinorrhea, congestion, fever, abdominal pain, urinary symptoms, nausea, vomiting, diarrhea, and leg swelling as associated symptoms.  She does not have a history of DVT.  She has not been immobilized recently or undergone surgery.  She does not take birth control.    Past Medical History  Diagnosis Date  . Bronchitis   . Asthma    Past Surgical History  Procedure Laterality Date  . Tubular ligation    .  Cholecystectomy    . Tonsillectomy Bilateral 04/19/2013    Procedure: TONSILLECTOMY;  Surgeon: Christia Reading, MD;  Location: [facility] OR;  Service: ENT;  Laterality: Bilateral;  . Tubal ligation     No family history on file. History  Substance Use Topics  . Smoking status: Never Smoker   . Smokeless tobacco: Never Used  . Alcohol Use: No   OB History    No data available     Review of Systems  Constitutional: Positive for chills. Negative for fever.  HENT: Negative for congestion, rhinorrhea and sore throat.   Respiratory: Positive for cough, chest tightness, shortness of breath and wheezing.   Cardiovascular: Positive for chest pain. Negative for leg swelling.  Gastrointestinal: Negative for nausea, vomiting, abdominal pain and diarrhea.  Genitourinary: Negative for dysuria, urgency, frequency, hematuria, decreased urine volume, enuresis and difficulty urinating.  Allergic/Immunologic: Negative for immunocompromised state.  Neurological: Positive for headaches.  All other systems reviewed and are negative.    Allergies  Shellfish allergy and Asa  Home Medications   Prior to Admission medications   Medication Sig Start Date End Date Taking? Authorizing Provider  cefdinir (OMNICEF) 300 MG capsule Take 1 capsule (300 mg total) by mouth 2 (two) times daily. 06/25/14   Rodolph Bong, MD  ibuprofen (ADVIL,MOTRIN) 800 MG tablet Take 1 tablet (800 mg total) by mouth 3 (three) times daily. 01/25/14   Reuben Likes, MD  ipratropium (ATROVENT) 0.06 % nasal spray Place 2 sprays into  both nostrils 4 (four) times daily. 06/25/14   Rodolph BongEvan S Corey, MD  predniSONE (DELTASONE) 10 MG tablet Take 3 tablets (30 mg total) by mouth daily. 06/25/14   Rodolph BongEvan S Corey, MD   Triage Vitals: BP 122/78 mmHg  Temp(Src) 99.2 F (37.3 C) (Oral)  Resp 22  SpO2 100%  Physical Exam  Constitutional: She appears well-developed and well-nourished. No distress.  HENT:  Head: Normocephalic and atraumatic.  Neck: Neck  supple.  Cardiovascular: Regular rhythm and normal heart sounds.  Tachycardia present.  Exam reveals no gallop and no friction rub.   No murmur heard. Pulmonary/Chest: Effort normal and breath sounds normal. No respiratory distress. She has no wheezes. She has no rales.  Increased work of breathing.  Paroxysmal coughing.  Musculoskeletal: She exhibits no edema.  Neurological: She is alert.  Skin: She is not diaphoretic.  Psychiatric: She has a normal mood and affect. Her behavior is normal.  Nursing note and vitals reviewed.   ED Course  Procedures (including critical care time)  DIAGNOSTIC STUDIES: Oxygen Saturation is 100% on room air, normal by my interpretation.    COORDINATION OF CARE: 12:27 PM- Will administer another breathing treatment in the ED and discharge the patient with hycodan and steroids.  The patient agreed to the treatment plan. 1:22 PM- Recheck.  Pt has received second nebulizer treatment.  Patient reports that her breathing has improved.  Will discharge patient with albuterol inhaler, steroids, and cough medication.  The patient agreed to the treatment plan.  Labs Review Labs Reviewed - No data to display  Imaging Review No results found.   EKG Interpretation None       1:30 PM Pt reports great improvement.  Feeling much better.  Lungs CTAB.    MDM   Final diagnoses:  Asthma exacerbation    Afebrile, nontoxic patient with cough, myalgias and asthma exacerbation.  +Sick contacts, likely viral respiratory syndrome exacerbating asthma.  Doubt PE.   D/C home with albuterol, prednisone, tussionex, PCP follow up.   Discussed result, findings, treatment, and follow up  with patient.  Pt given return precautions.  Pt verbalizes understanding and agrees with plan.         I personally performed the services described in this documentation, which was scribed in my presence. The recorded information has been reviewed and is accurate.    Trixie Dredgemily Jamey Demchak,  PA-C 07/03/14 1331  Azalia BilisKevin Campos, MD 07/03/14 [phone]

## 2014-07-03 NOTE — ED Notes (Signed)
1200 Pt c/o 2 day hx of shortness of breath, chest tightness. Denies NVD. HR 116

## 2014-07-03 NOTE — ED Notes (Signed)
Notified RN Anmarie of high pulse

## 2014-07-03 NOTE — Discharge Instructions (Signed)
Read the information below.  Use the prescribed medication as directed.  Please discuss all new medications with your pharmacist.  You may return to the Emergency Department at any time for worsening condition or any new symptoms that concern you.  If there is any possibility that you might be pregnant, please let your health care provider know and discuss this with the pharmacist to ensure medication safety.  If you develop worsening shortness of breath, uncontrolled wheezing, severe chest pain, or fevers despite using tylenol and/or ibuprofen, return for a recheck.       Asthma Asthma is a condition of the lungs in which the airways tighten and narrow. Asthma can make it hard to breathe. Asthma cannot be cured, but medicine and lifestyle changes can help control it. Asthma may be started (triggered) by:  Animal skin flakes (dander).  Dust.  Cockroaches.  Pollen.  Mold.  Smoke.  Cleaning products.  Hair sprays or aerosol sprays.  Paint fumes or strong smells.  Cold air, weather changes, and winds.  Crying or laughing hard.  Stress.  Certain medicines or drugs.  Foods, such as dried fruit, potato chips, and sparkling grape juice.  Infections or conditions (colds, flu).  Exercise.  Certain medical conditions or diseases.  Exercise or tiring activities. HOME CARE   Take medicine as told by your doctor.  Use a peak flow meter as told by your doctor. A peak flow meter is a tool that measures how well the lungs are working.  Record and keep track of the peak flow meter's readings.  Understand and use the asthma action plan. An asthma action plan is a written plan for taking care of your asthma and treating your attacks.  To help prevent asthma attacks:  Do not smoke. Stay away from secondhand smoke.  Change your heating and air conditioning filter often.  Limit your use of fireplaces and wood stoves.  Get rid of pests (such as roaches and mice) and their  droppings.  Throw away plants if you see mold on them.  Clean your floors. Dust regularly. Use cleaning products that do not smell.  Have someone vacuum when you are not home. Use a vacuum cleaner with a HEPA filter if possible.  Replace carpet with wood, tile, or vinyl flooring. Carpet can trap animal skin flakes and dust.  Use allergy-proof pillows, mattress covers, and box spring covers.  Wash bed sheets and blankets every week in hot water and dry them in a dryer.  Use blankets that are made of polyester or cotton.  Clean bathrooms and kitchens with bleach. If possible, have someone repaint the walls in these rooms with mold-resistant paint. Keep out of the rooms that are being cleaned and painted.  Wash hands often. GET HELP IF:  You have make a whistling sound when breaking (wheeze), have shortness of breath, or have a cough even if taking medicine to prevent attacks.  The colored mucus you cough up (sputum) is thicker than usual.  The colored mucus you cough up changes from clear or white to yellow, green, gray, or bloody.  You have problems from the medicine you are taking such as:  A rash.  Itching.  Swelling.  Trouble breathing.  You need reliever medicines more than 2-3 times a week.  Your peak flow measurement is still at 50-79% of your personal best after following the action plan for 1 hour.  You have a fever. GET HELP RIGHT AWAY IF:   You seem to be  worse and are not responding to medicine during an asthma attack.  You are short of breath even at rest.  You get short of breath when doing very little activity.  You have trouble eating, drinking, or talking.  You have chest pain.  You have a fast heartbeat.  Your lips or fingernails start to turn blue.  You are light-headed, dizzy, or faint.  Your peak flow is less than 50% of your personal best. MAKE SURE YOU:   Understand these instructions.  Will watch your condition.  Will get help  right away if you are not doing well or get worse. Document Released: 09/14/2007 Document Revised: 08/12/2013 Document Reviewed: 10/25/2012 [facility] Patient Information 2015 [place], [place]. This information is not intended to replace advice given to you by your health care provider. Make sure you discuss any questions you have with your health care provider.  Cough, Adult  A cough is a reflex. It helps you clear your throat and airways. A cough can help heal your body. A cough can last 2 or 3 weeks (acute) or may last more than 8 weeks (chronic). Some common causes of a cough can include an infection, allergy, or a cold. HOME CARE  Only take medicine as told by your doctor.  If given, take your medicines (antibiotics) as told. Finish them even if you start to feel better.  Use a cold steam vaporizer or humidifier in your home. This can help loosen thick spit (secretions).  Sleep so you are almost sitting up (semi-upright). Use pillows to do this. This helps reduce coughing.  Rest as needed.  Stop smoking if you smoke. GET HELP RIGHT AWAY IF:  You have yellowish-white fluid (pus) in your thick spit.  Your cough gets worse.  Your medicine does not reduce coughing, and you are losing sleep.  You cough up blood.  You have trouble breathing.  Your pain gets worse and medicine does not help.  You have a fever. MAKE SURE YOU:   Understand these instructions.  Will watch your condition.  Will get help right away if you are not doing well or get worse. Document Released: 12/09/2010 Document Revised: 08/12/2013 Document Reviewed: 12/09/2010 [facility] Patient Information 2015 [place], [place]. This information is not intended to replace advice given to you by your health care provider. Make sure you discuss any questions you have with your health care provider.

## 2014-07-03 NOTE — ED Notes (Signed)
  Pt reports increased shortness of breath x 2 days. Currently c/o chest tightness. HR 116.

## 2014-09-11 ENCOUNTER — Emergency Department (HOSPITAL_COMMUNITY)
Admission: EM | Admit: 2014-09-11 | Discharge: 2014-09-11 | Disposition: A | Discharge status: Home / Self Care | Payer: Medicaid Other | Attending: Emergency Medicine | Admitting: Emergency Medicine

## 2014-09-11 ENCOUNTER — Encounter (HOSPITAL_COMMUNITY): Payer: Self-pay | Admitting: Emergency Medicine

## 2014-09-11 DIAGNOSIS — Z7952 Long term (current) use of systemic steroids: Secondary | ICD-10-CM | POA: Insufficient documentation

## 2014-09-11 DIAGNOSIS — J45909 Unspecified asthma, uncomplicated: Secondary | ICD-10-CM | POA: Insufficient documentation

## 2014-09-11 DIAGNOSIS — Z79899 Other long term (current) drug therapy: Secondary | ICD-10-CM | POA: Diagnosis not present

## 2014-09-11 DIAGNOSIS — R51 Headache: Secondary | ICD-10-CM | POA: Insufficient documentation

## 2014-09-11 DIAGNOSIS — R519 Headache, unspecified: Secondary | ICD-10-CM

## 2014-09-11 DIAGNOSIS — Z3202 Encounter for pregnancy test, result negative: Secondary | ICD-10-CM | POA: Insufficient documentation

## 2014-09-11 LAB — POC URINE PREG, ED: Preg Test, Ur: NEGATIVE

## 2014-09-11 MED ORDER — KETOROLAC TROMETHAMINE 30 MG/ML IJ SOLN
30.0000 mg | Freq: Once | INTRAMUSCULAR | Status: AC
  Administered 2014-09-11: 30 mg via INTRAMUSCULAR
  Filled 2014-09-11: qty 1

## 2014-09-11 MED ORDER — METOCLOPRAMIDE HCL 10 MG PO TABS
10.0000 mg | ORAL_TABLET | Freq: Once | ORAL | Status: AC
  Administered 2014-09-11: 10 mg via ORAL
  Filled 2014-09-11: qty 1

## 2014-09-11 MED ORDER — KETOROLAC TROMETHAMINE 30 MG/ML IJ SOLN: 30.0000 mg | Freq: Once | INTRAMUSCULAR | Status: DC

## 2014-09-11 MED ORDER — DIPHENHYDRAMINE HCL 25 MG PO CAPS
25.0000 mg | ORAL_CAPSULE | Freq: Once | ORAL | Status: AC
  Administered 2014-09-11: 25 mg via ORAL
  Filled 2014-09-11: qty 1

## 2014-09-11 NOTE — ED Notes (Signed)
Headache started Tuesday night, worse last night, states is nauseated and is photophobic. States vision got blurry last night.

## 2014-09-11 NOTE — ED Notes (Signed)
Pt assisted to bathroom to obtain urine sample

## 2014-09-11 NOTE — ED Notes (Signed)
NAD at this time. Pt is stable and going home.  

## 2014-09-11 NOTE — ED Provider Notes (Signed)
CSN: [id number]     Arrival date & time 09/11/14  0841 History   None    Chief Complaint  Patient presents with  . Headache    HPI   [age] YOF presents today with headache. Pt reports a lifelong history  of headaches, but reports worsening over the last 2 months. Reorts that headache starts low, and her right side with a throbbing sensation. He reports associated photophobia, denies fever, neck stiffness, nausea, vomiting, dizziness, focal neurological deficits, or any other concerning signs or symptoms. Patient reports that last night she had "blurry" vision, but reports this resolved spontaneously without intervention and has not returned. Patient reports that headaches are not relieved with over-the-counter medications, usually last 4-5 days and resolve on their own. 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 She is uncertain if this is worsening her headaches.     Past Medical History  Diagnosis Date  . Bronchitis   . Asthma    Past Surgical History  Procedure Laterality Date  . Tubular ligation    . Cholecystectomy    . Tonsillectomy Bilateral 04/19/2013    Procedure: TONSILLECTOMY;  Surgeon: Christia Readingwight Bates, MD;  [facility];  Service: ENT;  Laterality: Bilateral;  . Tubal ligation     Family History  Problem Relation Age of Onset  . Hypertension Mother   . Cancer Father    History  Substance Use Topics  . Smoking status: Never Smoker   . Smokeless tobacco: Never Used  . Alcohol Use: No   OB History    No data available     Review of Systems  All other systems reviewed and are negative.   Allergies  Shellfish allergy and Asa  Home Medications   Prior to Admission medications   Medication Sig Start Date End Date Taking? Authorizing Provider  cefdinir (OMNICEF) 300 MG capsule Take  1 capsule (300 mg total) by mouth 2 (two) times daily. 06/25/14   Rodolph BongEvan S Corey, MD  chlorpheniramine-HYDROcodone (TUSSIONEX PENNKINETIC ER) 10-8 MG/5ML LQCR Take 5 mLs by mouth every 12 (twelve) hours as needed for cough (and pain). 07/03/14   Trixie DredgeEmily West, PA-C  ibuprofen (ADVIL,MOTRIN) 800 MG tablet Take 1 tablet (800 mg total) by mouth 3 (three) times daily. 01/25/14   Reuben Likesavid C Keller, MD  ipratropium (ATROVENT) 0.06 % nasal spray Place 2 sprays into both nostrils 4 (four) times daily. 06/25/14   Rodolph BongEvan S Corey, MD  predniSONE (DELTASONE) 20 MG tablet 3 tabs po day one, then 2 tabs daily x 4 days 07/03/14   Trixie DredgeEmily West, PA-C   BP 121/78 mmHg  Pulse 81  Temp(Src) 98.9 F (37.2 C) (Oral)  Resp 16  SpO2 100%  LMP 08/22/2014 (Exact Date) Physical Exam  Constitutional: She is oriented to person, place, and time. She appears well-developed and well-nourished.  HENT:  Head: Normocephalic and atraumatic.  Eyes: Pupils are equal, round, and reactive to light.  Neck: Normal range of motion. Neck supple. No JVD present. No tracheal deviation present. No thyromegaly present.  Cardiovascular: Normal rate, regular rhythm, normal heart sounds and intact distal pulses.  Exam reveals no gallop and no friction rub.   No murmur heard. Pulmonary/Chest: Effort normal and breath sounds normal. No stridor. No respiratory distress. She has no wheezes. She has no rales. She exhibits no  tenderness.  Musculoskeletal: Normal range of motion.  Lymphadenopathy:    She has no cervical adenopathy.  Neurological: She is alert and oriented to person, place, and time. She has normal strength. No cranial nerve deficit or sensory deficit. She displays a negative Romberg sign. Coordination and gait normal. GCS eye subscore is 4. GCS verbal subscore is 5. GCS motor subscore is 6.  Reflex Scores:      Patellar reflexes are 2+ on the right side and 2+ on the left side. Skin: Skin is warm and dry.  Psychiatric: She has a normal mood  and affect. Her behavior is normal. Judgment and thought content normal.  Nursing note and vitals reviewed.   ED Course  Procedures (including critical care time) Labs Review Labs Reviewed  POC URINE PREG, ED    Imaging Review No results found.   EKG Interpretation None      MDM   Final diagnoses:  Headache, unspecified headache type    Labs: Point of care urine pregnant negative  Imaging: None  Consults: None  Therapeutics: Benadryl, Reglan, Toradol  Assessment: Headache  Plan: Patient presents with a headache, no red flags, treated in the ED with near complete symptom resolution. Patient was given resource guide, [place] unwellness follow-up further evaluation and management. She is given strict return precautions the event new or worsening signs or symptoms presented, she verbalized her understanding and agreement today's plan and had no further questions or concerns at time of discharge.      Eyvonne Mechanic, PA-C 09/11/14 1238  Cathren Laine, MD 09/12/14 [phone]

## 2014-09-11 NOTE — Discharge Instructions (Signed)
°Emergency Department Resource Guide °1) Find a Doctor and Pay Out of Pocket °Although you won't have to find out who is covered by your insurance plan, it is a good idea to ask around and get recommendations. You will then need to call the office and see if the doctor you have chosen will accept you as a new patient and what types of options they offer for patients who are self-pay. Some doctors offer discounts or will set up payment plans for their patients who do not have insurance, but you will need to ask so you aren't surprised when you get to your appointment. ° °2) Contact Your Local Health Department °Not all health departments have doctors that can see patients for sick visits, but many do, so it is worth a call to see if yours does. If you don't know where your local health department is, you can check in your phone book. The CDC also has a tool to help you locate your state's health department, and many state websites also have listings of all of their local health departments. ° °3) Find a Walk-in Clinic °If your illness is not likely to be very severe or complicated, you may want to try a walk in clinic. These are popping up all over the country in pharmacies, drugstores, and shopping centers. They're usually staffed by nurse practitioners or physician assistants that have been trained to treat common illnesses and complaints. They're usually fairly quick and inexpensive. However, if you have serious medical issues or chronic medical problems, these are probably not your best option. ° °No Primary Care Doctor: °- Call [facility] Connect at  832-8000 - they can help you locate a primary care doctor that  accepts your insurance, provides certain services, etc. °- Physician Referral Service- 1-800-533-3463 ° °Chronic Pain Problems: °Organization         Address  Phone   Notes  °[facility]  (336) 297-2271 Patients need to be referred by their primary care doctor.  ° °Medication  Assistance: °Organization         Address  Phone   Notes  °[place] [facility] [address] °[place], [place] [address] (336) 641-8030 --Must be a resident of [place] °-- Must have NO insurance coverage whatsoever (no Medicaid/ Medicare, etc.) °-- The pt. MUST have a primary care doctor that directs their care regularly and follows them in the community °  °[facility]  (866) 331-1348   °[employer]  (888) 892-1162   ° °Agencies that provide inexpensive medical care: °Organization         Address  Phone   Notes  °[facility]  (336) 832-8035   °[facility]    (336) 832-7272   °[facility] [address] °[place], [place] [address] (336) 832-4777   °[facility] [address], °[place] (336) 271-4999   °[facility]    (336) 373-0678   °[facility]    (336) 272-1050   °[facility] ° [address], [place] Phone:  (336) 832-4444, Fax:  (336) 832-4440 Hours of Operation:  9 am - 6 pm, M-F.  Also accepts Medicaid/Medicare and self-pay.  °[facility] ° [address], [place] Phone: (336) 832-3150, Fax: (336) [phone]. Hours of Operation:  8:30 am - 5:30 pm, M-F.  Also accepts Medicaid and self-pay.  °[facility] [place] [address], [place] Phone: (336) 878-6027   °[facility] [address], [place], [place] (336)723-1848, Ext. 123 Mondays & Thursdays: 7-9 AM.  First 15 patients are seen on a first come, first serve basis. °  ° °Medicaid-accepting [place] Providers: ° °Organization         Address  Phone   Notes  °[facility] [address], [place] (336) 641-2100 Also accepts self-pay patients.  °[facility] [address], [place] ° (336) 856-9996   °[facility] [address], [place]  (336) 288-8857   °[facility] [address] [place] [address], [place] (336) 299-7000   °Veita Bland [address], [place]  ° (336) 373-1557 Only accepts [place] Access Medicaid patients after they have their name applied to their card.  ° °Self-Pay (no insurance) in [place]: ° °Organization         Address  Phone   Notes  °Sickle Cell Patients, [facility] [address], [place] (336) 832-1970   °[facility] [address], [place] (336) 832-4400   °[facility] ° [address], [place] (336) 992-4800   °[facility]/Dr. Osei-Bonsu ° [address] [place] [address], [place] or [address], [place] (336) 841-8500 Phone number for both [place] and [place] locations is the same.  °[facility] [address], [place] (336) 299-0000   °[facility] [address] [place] [address], [place] or [address] (336) 852-7530 °(336) 878-2260   °[facility] [address], [place] (336) 350-1642, phone; (336) 294-5005, fax Sees patients 1st and 3rd Saturday of every month.  Must not qualify for public or private insurance (i.e. Medicaid, Medicare, [place] Health Choice, Veterans' Benefits) • Household income should be no more than 200% of the poverty level •The clinic cannot treat you if you are pregnant or think you are pregnant • Sexually transmitted diseases are not treated at the clinic.  ° ° °Dental Care: °Organization         Address  Phone  Notes  °[place] [facility] [address], [place] (336) 641-6152 Accepts children up to age 21 who are enrolled in Medicaid or [place] Health Choice; pregnant women with a Medicaid card; and children who have applied for Medicaid or [place] Health Choice, but were declined, whose parents can pay a reduced fee at time of service.  °[place]  [facility] [place]  [address], [place] (336) 641-7733 Accepts children up to age 21 who are enrolled in Medicaid or [place] Health Choice; pregnant women with a Medicaid card; and children who have applied for Medicaid or [place] Health Choice, but were declined, whose parents can pay a reduced fee at time of service.  °[facility] ° [address], [place] (336) 641-4533 Patients are seen by appointment only. Walk-ins are not accepted. [facility] will see patients 18 years of age and older. °Monday - Tuesday (8am-5pm) °Most Wednesdays (8:30-5pm) °$30 per visit, cash only  °[facility] ° [address], [place] (336) 641-4533 Patients are seen by appointment only. Walk-ins are not accepted. [facility] will see patients 18 years of age and older. °One   Wednesday Evening (Monthly: Volunteer Based).  $30 per visit, cash only  °[facility]  (919) 537-3737 for adults; Children under age 4, call Graduate Pediatric Dentistry at ([phone]) [phone]. Children aged 4-14, please call ([phone]) [phone] to request a pediatric application. ° Dental services are provided in all areas of dental care including fillings, crowns and bridges, complete and partial dentures, implants, gum treatment, root canals, and extractions. Preventive care is also provided. Treatment is provided to both adults and children. °Patients are selected via a lottery and there is often a waiting list. °  °[facility] [address], °[place] ° (336) 763-8833 [email or web address] °  °[facility] [address], [place], [place] (336)723-1848, Ext. 123 Second and Fourth Thursday of each month, opens at 6:30 AM; Clinic ends at 9 AM.  Patients are seen on a first-come first-served basis, and a limited number are seen during each clinic.  ° °[facility] ° [address], [place], [place] (336) 723-7904    Eligibility Requirements °You must have lived in [place], [place], or [place] counties for at least the last three months. °  You cannot be eligible for state or federal sponsored healthcare insurance, including Veterans Administration, Medicaid, or Medicare. °  You generally cannot be eligible for healthcare insurance through your employer.  °  How to apply: °Eligibility screenings are held every Tuesday and Wednesday afternoon from 1:00 pm until 4:00 pm. You do not need an appointment for the interview!  °[facility] [address], [place], [place] 336-631-2330   °[facility]  336-342-8273   °[place]  336-703-3100   °[facility]  336-570-6415   ° °Behavioral Health Resources in the Community: °Intensive Outpatient Programs °Organization         Address  Phone  Notes  °[facility] [address], [place], [place] 336-878-6098   °[facility] [address], [place], [place] 336-832-9800   °[facility] [address], [place], [place] ° 336-882-2125   °[facility] [address],  °[place], [place] 1-800-853-5163 or 336-641-4981   °Substance Abuse Resources °Organization         Address  Phone  Notes  °[facility]  336-882-2125   °[facility]  336-784-9470   °[facility]  336-285-9073   °[facility]  336-845-3988   °Residential & Outpatient Substance Abuse Program  1-800-659-3381   °Psychological Services °Organization         Address  Phone  Notes  °[facility]  336- 832-9600   °[facility]  336- 378-7881   °[facility] [address], [place] 1-800-853-5163 or 336-641-4981   ° °Mobile Crisis Teams °Organization         Address  Phone  Notes  °[facility], Mobile Crisis Care Unit  1-877-626-1772   °Assertive °[facility] ° [address]  [place], [place] 336-834-9664   °Sharon DeEsch [address] °[place] 336-554-5454   ° °Self-Help/Support Groups °Organization         Address  Phone             Notes  °[facility] of  - variety of support groups  336- 373-1402 Call for more information  °Narcotics Anonymous (NA), [facility] [address], °[place]  2 meetings at this location  ° °  Residential Treatment Programs Organization         Address  Phone  Notes  [facility] [address],    [place]  [phone])[phone]   [facility]  [address], [place] [phone], [place], [place] [phone]   [facility] [address], [place] [phone] Admissions: 8am-3pm M-F  [facility] [address]    [place], [place] [phone]   [facility] [address], [place], [place] [phone]   [facility] [address]  [place], [place] [phone]   [facility] Intensive Outpatient [address], [place], [place] [phone]   [facility] [address]  [place], [place] [phone])[phone] or [phone]   [facility] ([facility] [address] [place], [place] [phone] Accepts Medicaid  [facility] [address]  [place] [phone] Substance Abuse/Addiction Treatment   [facility]         Address  Phone  Notes  [facility]  8506361145(888) [phone]   Angie FavaJulie Brannon, PhD [address], [address] [place], [place]   [phone](336) [phone] or [phone](336) [phone]   [facility]   [address] [place], [place] [phone](336) [phone]   [facility] [address], [place], [place] [phone](336) [phone] Insurance/Medicaid/sponsorship through [facility] [address]                                    [place], [place] [phone](336) [phone] Therapy/tele-psych/case    [facility] [address]   [place], [place] [phone]) [phone]    Dr. Lolly MustacheArfeen  [phone]) [phone]   [facility] [place]  [employer] [facility] 1) [address], [place] 2) [address], [place] 3)  [address], [place] [phone]) [phone](336) [phone]  [phone]) [phone]   [facility] Child Abuse Hotline 4164320634(336) [phone] or [phone](336) [phone] (After Hours)     Please follow-up with [facility] for further evaluation and management, please see attached resort guide for primary care providers.

## 2017-04-13 ENCOUNTER — Other Ambulatory Visit: Payer: Self-pay

## 2017-04-13 ENCOUNTER — Emergency Department (HOSPITAL_COMMUNITY)
Admission: EM | Admit: 2017-04-13 | Discharge: 2017-04-13 | Disposition: A | Discharge status: Home / Self Care | Payer: BLUE CROSS/BLUE SHIELD | Attending: Emergency Medicine | Admitting: Emergency Medicine

## 2017-04-13 ENCOUNTER — Encounter (HOSPITAL_COMMUNITY): Payer: Self-pay | Admitting: *Deleted

## 2017-04-13 DIAGNOSIS — J45909 Unspecified asthma, uncomplicated: Secondary | ICD-10-CM | POA: Diagnosis not present

## 2017-04-13 DIAGNOSIS — R0981 Nasal congestion: Secondary | ICD-10-CM | POA: Diagnosis present

## 2017-04-13 DIAGNOSIS — J069 Acute upper respiratory infection, unspecified: Secondary | ICD-10-CM | POA: Diagnosis not present

## 2017-04-13 DIAGNOSIS — Z79899 Other long term (current) drug therapy: Secondary | ICD-10-CM | POA: Insufficient documentation

## 2017-04-13 DIAGNOSIS — B9789 Other viral agents as the cause of diseases classified elsewhere: Secondary | ICD-10-CM | POA: Insufficient documentation

## 2017-04-13 LAB — RAPID STREP SCREEN (MED CTR MEBANE ONLY): STREPTOCOCCUS, GROUP A SCREEN (DIRECT): NEGATIVE

## 2017-04-13 MED ORDER — BENZONATATE 100 MG PO CAPS: 100.0000 mg | ORAL_CAPSULE | Freq: Three times a day (TID) | ORAL | 0 refills | Status: DC

## 2017-04-13 MED ORDER — DEXAMETHASONE SODIUM PHOSPHATE 10 MG/ML IJ SOLN
10.0000 mg | Freq: Once | INTRAMUSCULAR | Status: AC
  Administered 2017-04-13: 10 mg
  Filled 2017-04-13: qty 1

## 2017-04-13 MED ORDER — FLUTICASONE PROPIONATE 50 MCG/ACT NA SUSP: 2.0000 | Freq: Every day | NASAL | 12 refills | Status: DC

## 2017-04-13 MED ORDER — BENZONATATE 100 MG PO CAPS
100.0000 mg | ORAL_CAPSULE | Freq: Once | ORAL | Status: AC
Start: 2017-04-13 — End: 2017-04-13
  Administered 2017-04-13: 100 mg via ORAL
  Filled 2017-04-13: qty 1

## 2017-04-13 MED ORDER — IPRATROPIUM-ALBUTEROL 0.5-2.5 (3) MG/3ML IN SOLN
3.0000 mL | Freq: Once | RESPIRATORY_TRACT | Status: AC
  Administered 2017-04-13: 3 mL via RESPIRATORY_TRACT
  Filled 2017-04-13: qty 3

## 2017-04-13 NOTE — Discharge Instructions (Signed)
Your strep test was negative in the ED.  This is likely a viral illness.  I have given you Tessalon for cough.  Use Flonase for nasal congestion.  Use an over-the-counter nasal rinse.  If symptoms last longer than 10 days need to be reevaluated to make sure you do not need antibiotics at that time.  If you start developing worsening productive cough, high fevers return to the ED.

## 2017-04-13 NOTE — ED Triage Notes (Signed)
Pt reports congestion, headache, sore throat for 2 days.

## 2017-04-13 NOTE — ED Provider Notes (Signed)
[facility] EMERGENCY DEPARTMENT Provider Note   CSN: [id number] Arrival date & time: 04/13/17  1029     History   Chief Complaint Chief Complaint  Patient presents with  . Nasal Congestion    HPI Carla Barton is a 42 y.o. female.  HPI 42 year old African American female past medical history significant for asthma presents to the emergency department today with complaints of nasal congestion, sore throat, rhinorrhea.  Patient states that her symptoms started approximately 2 days ago.  She reports sinus pressure with yellow-green discharge.  She reports minimal cough.  She also reports some sore throat and postnasal drip.  Patient reports generalized body aches but denies any fevers or chills.  She is been trying over-the-counter DayQuil with only little relief.  Denies any known sick contacts.  Did not receive a flu vaccine this year.  Denies any associated chest pain, shortness of breath, wheezing, otalgia.  Nothing makes her symptoms better or worse. Past Medical History:  Diagnosis Date  . Asthma   . Bronchitis     Patient Active Problem List   Diagnosis Date Noted  . 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 Anxiety in acute stress reaction 01/10/2014  . Peritonsillar abscess 04/19/2013    Past Surgical History:  Procedure Laterality Date  . CHOLECYSTECTOMY    . TONSILLECTOMY Bilateral 04/19/2013   Procedure: TONSILLECTOMY;  Surgeon: Christia Reading, MD;  Location[facility] OR;  Service: ENT;  Laterality: Bilateral;  . TUBAL LIGATION    . tubular ligation      OB History    No data available       Home Medications    Prior to Admission medications   Medication Sig Start Date End Date Taking? Authorizing Provider  benzonatate (TESSALON) 100 MG capsule Take 1 capsule (100 mg total) by mouth every 8 (eight) hours. 04/13/17   Rise Mu, PA-C  cefdinir (OMNICEF) 300 MG capsule Take 1 capsule (300 mg total) by mouth 2 (two) times daily. 06/25/14   Rodolph Bong, MD  chlorpheniramine-HYDROcodone [facility] PENNKINETIC ER) 10-8 MG/5ML LQCR Take 5 mLs by mouth every 12 (twelve) hours as needed for cough (and pain). 07/03/14   Trixie Dredge, PA-C  fluticasone (FLONASE) 50 MCG/ACT nasal spray Place 2 sprays into both nostrils daily. 04/13/17   Rise Mu, PA-C  ibuprofen (ADVIL,MOTRIN) 800 MG tablet Take 1 tablet (800 mg total) by mouth 3 (three) times daily. 01/25/14   Reuben Likes, MD  ipratropium (ATROVENT) 0.06 % nasal spray Place 2 sprays into both nostrils 4 (four) times daily. 06/25/14   Rodolph Bong, MD  predniSONE (DELTASONE) 20 MG tablet 3 tabs po day one, then 2 tabs daily x 4 days 07/03/14   Trixie Dredge, PA-C    Family History Family History  Problem Relation Age of Onset  . Hypertension Mother   . Cancer Father     Social History Social History   Tobacco Use  . Smoking status: Never Smoker  . Smokeless tobacco: Never Used  Substance Use Topics  . Alcohol use: No  . Drug use: No     Allergies   Shellfish allergy and Asa [aspirin]   Review of Systems Review of Systems  Constitutional: Negative for chills and fever.  HENT: Positive for congestion, rhinorrhea, sinus pressure, sinus pain and sore throat. Negative for ear pain.   Respiratory: Positive for cough. Negative for shortness of breath and wheezing.   Cardiovascular: Negative for chest pain.  Gastrointestinal: Negative  for abdominal pain and vomiting.  Musculoskeletal: Positive for myalgias.  Skin: Negative for rash.     Physical Exam Updated Vital Signs BP (!) 131/94 (BP Location: Right Arm)   Pulse 98   Temp 98.6 F (37 C) (Oral)   Resp 16   SpO2 100%   Physical Exam  Constitutional: She appears well-developed and well-nourished. No distress.  HENT:  Head: Normocephalic and atraumatic.  Right Ear: Tympanic membrane, external ear and ear canal normal.  Left Ear: Tympanic membrane, external ear and ear canal normal.  Nose: Mucosal edema and  rhinorrhea present. Right sinus exhibits maxillary sinus tenderness. Right sinus exhibits no frontal sinus tenderness. Left sinus exhibits maxillary sinus tenderness. Left sinus exhibits no frontal sinus tenderness.  Mouth/Throat: Uvula is midline and mucous membranes are normal. No trismus in the jaw. No uvula swelling. Posterior oropharyngeal edema and posterior oropharyngeal erythema present. No oropharyngeal exudate or tonsillar abscesses. Tonsils are 2+ on the right. Tonsils are 2+ on the left.  Eyes: Conjunctivae are normal. Right eye exhibits no discharge. Left eye exhibits no discharge. No scleral icterus.  Neck: Normal range of motion. Neck supple.  Pulmonary/Chest: Effort normal and breath sounds normal. No stridor. No respiratory distress. She has no wheezes. She has no rales. She exhibits no tenderness.  Musculoskeletal: Normal range of motion.  Lymphadenopathy:    She has cervical adenopathy (biltateral).  Neurological: She is alert.  Skin: Skin is warm and dry. No pallor.  Psychiatric: Her behavior is normal. Judgment and thought content normal.  Nursing note and vitals reviewed.    ED Treatments / Results  Labs (all labs ordered are listed, but only abnormal results are displayed) Labs Reviewed  RAPID STREP SCREEN (NOT AT [facility])  CULTURE, GROUP A STREP [facility])    EKG  EKG Interpretation None       Radiology No results found.  Procedures Procedures (including critical care time)  Medications Ordered in ED Medications  ipratropium-albuterol (DUONEB) 0.5-2.5 (3) MG/3ML nebulizer solution 3 mL (3 mLs Nebulization Given 04/13/17 1315)  dexamethasone (DECADRON) injection 10 mg (10 mg Other Given 04/13/17 1315)  benzonatate (TESSALON) capsule 100 mg (100 mg Oral Given 04/13/17 1316)     Initial Impression / Assessment and Plan / ED Course  I have reviewed the triage vital signs and the nursing notes.  Pertinent labs & imaging results that were available during my care  of the patient were reviewed by me and considered in my medical decision making (see chart for details).     Patient presents the ED with complaints of sinus pressure, sinus congestion, rhinorrhea, sore throat.  On exam patient is overall well-appearing and nontoxic.  Patient's lungs clear to auscultation bilaterally.  Patient is afebrile.  Low suspicion for pneumonia and do not feel that imaging is indicated at this time.  Strep test was negative.  No signs of deep neck infection or peritonsillar abscess.  This is likely a viral pharyngitis.  Patient given cough medicine and steroids in the ED.  Also given a DuoNeb for her cough but patient has no lung findings such as wheezing or rhonchi.  Patients symptoms are consistent with URI, likely viral etiology. Discussed that antibiotics are not indicated for viral infections. Pt will be discharged with symptomatic treatment.    Pt is hemodynamically stable, in NAD, & able to ambulate in the ED. Evaluation does not show pathology that would require ongoing emergent intervention or inpatient treatment. I explained the diagnosis to the patient. Pain  has been managed & has no complaints prior to dc. Pt is comfortable with above plan and is stable for discharge at this time. All questions were answered prior to disposition. Strict return precautions for f/u to the ED were discussed. Encouraged follow up with PCP.   Final Clinical Impressions(s) / ED Diagnoses   Final diagnoses:  Viral URI with cough    ED Discharge Orders        Ordered    benzonatate (TESSALON) 100 MG capsule  Every 8 hours     04/13/17 1327    fluticasone (FLONASE) 50 MCG/ACT nasal spray  Daily     04/13/17 1327       Rise Mu, PA-C 04/13/17 1335    Donnetta Hutching, MD 04/16/17 1349

## 2017-04-15 LAB — CULTURE, GROUP A STREP (THRC)

## 2017-04-20 ENCOUNTER — Encounter (HOSPITAL_COMMUNITY): Payer: Self-pay | Admitting: Emergency Medicine

## 2017-04-20 DIAGNOSIS — R479 Unspecified speech disturbances: Secondary | ICD-10-CM | POA: Insufficient documentation

## 2017-04-20 DIAGNOSIS — Z79899 Other long term (current) drug therapy: Secondary | ICD-10-CM | POA: Insufficient documentation

## 2017-04-20 DIAGNOSIS — Z9049 Acquired absence of other specified parts of digestive tract: Secondary | ICD-10-CM | POA: Diagnosis not present

## 2017-04-20 DIAGNOSIS — H9201 Otalgia, right ear: Secondary | ICD-10-CM | POA: Diagnosis present

## 2017-04-20 DIAGNOSIS — F419 Anxiety disorder, unspecified: Secondary | ICD-10-CM | POA: Insufficient documentation

## 2017-04-20 DIAGNOSIS — J45909 Unspecified asthma, uncomplicated: Secondary | ICD-10-CM | POA: Insufficient documentation

## 2017-04-20 DIAGNOSIS — H66001 Acute suppurative otitis media without spontaneous rupture of ear drum, right ear: Secondary | ICD-10-CM | POA: Insufficient documentation

## 2017-04-20 NOTE — ED Triage Notes (Signed)
Patient reports nasal congestion/sinus pressure with right ear ache onset this week with mild hearing loss , denies injury no drainage or fever .

## 2017-04-21 ENCOUNTER — Emergency Department (HOSPITAL_COMMUNITY)
Admission: EM | Admit: 2017-04-21 | Discharge: 2017-04-21 | Disposition: A | Discharge status: Home / Self Care | Payer: BLUE CROSS/BLUE SHIELD | Attending: Emergency Medicine | Admitting: Emergency Medicine

## 2017-04-21 DIAGNOSIS — H66001 Acute suppurative otitis media without spontaneous rupture of ear drum, right ear: Secondary | ICD-10-CM

## 2017-04-21 MED ORDER — AZITHROMYCIN 250 MG PO TABS: 250.0000 mg | ORAL_TABLET | Freq: Every day | ORAL | 0 refills | Status: DC

## 2017-04-21 NOTE — Discharge Instructions (Signed)
Zithromax as prescribed.  To new over-the-counter decongestants such as Sudafed as needed.  Follow-up with primary doctor if not improving in the next week.

## 2017-04-21 NOTE — ED Provider Notes (Signed)
[facility] Provider Note   CSN: [id number] Arrival date & time: 04/20/17  2243     History   Chief Complaint Chief Complaint  Patient presents with  . Otalgia  . Nasal Congestion    HPI Carla Barton is a 42 y.o. female.  Patient is a 42 year old female with no significant past medical history.  She presents with a 10-day history of nasal congestion.  She was seen here 1 week ago and diagnosed with a viral URI.  She was taken the prescribed medications, however is not improving.  She is now complaining of pain and pressure in her right ear along with decreased hearing.   The history is provided by the patient.  Otalgia  This is a new problem. The current episode started 2 days ago. There is pain in the right ear. The problem occurs constantly. The problem has been gradually worsening. There has been no fever. The pain is moderate.    Past Medical History:  Diagnosis Date  . Asthma   . Bronchitis     Patient Active Problem List   Diagnosis Date Noted  . 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 Anxiety in acute stress reaction 01/10/2014  . Peritonsillar abscess 04/19/2013    Past Surgical History:  Procedure Laterality Date  . CHOLECYSTECTOMY    . TONSILLECTOMY Bilateral 04/19/2013   Procedure: TONSILLECTOMY;  Surgeon: Christia Readingwight Bates, MD;  [facility];  Service: ENT;  Laterality: Bilateral;  . TUBAL LIGATION    . tubular ligation      OB History    No data available       Home Medications    Prior to Admission medications   Medication Sig Start Date End Date Taking? Authorizing Provider  benzonatate (TESSALON) 100 MG capsule Take 1 capsule (100 mg total) by mouth every 8 (eight) hours. 04/13/17   Rise MuLeaphart, Kenneth T, PA-C  cefdinir (OMNICEF) 300 MG capsule Take 1 capsule (300 mg total) by mouth 2 (two) times daily. 06/25/14   Rodolph Bongorey, Evan S, MD  chlorpheniramine-HYDROcodone [facility](TUSSIONEX PENNKINETIC ER) 10-8 MG/5ML LQCR Take 5  mLs by mouth every 12 (twelve) hours as needed for cough (and pain). 07/03/14   Trixie DredgeWest, Emily, PA-C  fluticasone (FLONASE) 50 MCG/ACT nasal spray Place 2 sprays into both nostrils daily. 04/13/17   Rise MuLeaphart, Kenneth T, PA-C  ibuprofen (ADVIL,MOTRIN) 800 MG tablet Take 1 tablet (800 mg total) by mouth 3 (three) times daily. 01/25/14   Reuben LikesKeller, David C, MD  ipratropium (ATROVENT) 0.06 % nasal spray Place 2 sprays into both nostrils 4 (four) times daily. 06/25/14   Rodolph Bongorey, Evan S, MD  predniSONE (DELTASONE) 20 MG tablet 3 tabs po day one, then 2 tabs daily x 4 days 07/03/14   Trixie DredgeWest, Emily, PA-C    Family History Family History  Problem Relation Age of Onset  . Hypertension Mother   . Cancer Father     Social History Social History   Tobacco Use  . Smoking status: Never Smoker  . Smokeless tobacco: Never Used  Substance Use Topics  . Alcohol use: No  . Drug use: No     Allergies   Shellfish allergy and Asa [aspirin]   Review of Systems Review of Systems  HENT: Positive for ear pain.   All other systems reviewed and are negative.    Physical Exam Updated Vital Signs BP (!) 153/96 (BP Location: Right Arm)   Pulse 75   Temp 98.1 F (36.7 C) (Oral)  Resp 15   Ht 5\' 7"  (1.702 m)   Wt 127 kg (280 lb)   LMP 04/18/2017   SpO2 100%   BMI 43.85 kg/m   Physical Exam  Constitutional: She is oriented to person, place, and time. She appears well-developed and well-nourished. No distress.  HENT:  Head: Normocephalic and atraumatic.  Mouth/Throat: Oropharynx is clear and moist.  The right TM is erythematous and bulging.  Neck: Normal range of motion. Neck supple.  Cardiovascular: Normal rate and regular rhythm. Exam reveals no gallop and no friction rub.  No murmur heard. Pulmonary/Chest: Effort normal and breath sounds normal. No respiratory distress. She has no wheezes.  Abdominal: Soft. Bowel sounds are normal. She exhibits no distension. There is no tenderness.  Musculoskeletal:  Normal range of motion.  Neurological: She is alert and oriented to person, place, and time.  Skin: Skin is warm and dry. She is not diaphoretic.  Nursing note and vitals reviewed.    ED Treatments / Results  Labs (all labs ordered are listed, but only abnormal results are displayed) Labs Reviewed - No data to display  EKG  EKG Interpretation None       Radiology No results found.  Procedures Procedures (including critical care time)  Medications Ordered in ED Medications - No data to display   Initial Impression / Assessment and Plan / ED Course  I have reviewed the triage vital signs and the nursing notes.  Pertinent labs & imaging results that were available during my care of the patient were reviewed by me and considered in my medical decision making (see chart for details).  Patient has an otitis media on top of her upper respiratory infection.  She will be given Zithromax and advised to continue over-the-counter decongestants.  Final Clinical Impressions(s) / ED Diagnoses   Final diagnoses:  None    ED Discharge Orders    None       Geoffery Lyons, MD 04/21/17 [phone]

## 2017-06-09 DIAGNOSIS — S62628A Displaced fracture of medial phalanx of other finger, initial encounter for closed fracture: Secondary | ICD-10-CM

## 2017-06-09 HISTORY — DX: Displaced fracture of middle phalanx of other finger, initial encounter for closed fracture: S62.628A

## 2017-06-25 ENCOUNTER — Emergency Department (HOSPITAL_COMMUNITY): Payer: BLUE CROSS/BLUE SHIELD

## 2017-06-25 ENCOUNTER — Emergency Department (HOSPITAL_COMMUNITY)
Admission: EM | Admit: 2017-06-25 | Discharge: 2017-06-25 | Disposition: A | Discharge status: Home / Self Care | Payer: BLUE CROSS/BLUE SHIELD | Attending: Physician Assistant | Admitting: Physician Assistant

## 2017-06-25 ENCOUNTER — Other Ambulatory Visit: Payer: Self-pay

## 2017-06-25 DIAGNOSIS — S62626A Displaced fracture of medial phalanx of right little finger, initial encounter for closed fracture: Secondary | ICD-10-CM | POA: Diagnosis not present

## 2017-06-25 DIAGNOSIS — Y929 Unspecified place or not applicable: Secondary | ICD-10-CM | POA: Insufficient documentation

## 2017-06-25 DIAGNOSIS — Z79899 Other long term (current) drug therapy: Secondary | ICD-10-CM | POA: Insufficient documentation

## 2017-06-25 DIAGNOSIS — W2209XA Striking against other stationary object, initial encounter: Secondary | ICD-10-CM | POA: Diagnosis not present

## 2017-06-25 DIAGNOSIS — J45909 Unspecified asthma, uncomplicated: Secondary | ICD-10-CM | POA: Diagnosis not present

## 2017-06-25 DIAGNOSIS — Y9389 Activity, other specified: Secondary | ICD-10-CM | POA: Insufficient documentation

## 2017-06-25 DIAGNOSIS — S6991XA Unspecified injury of right wrist, hand and finger(s), initial encounter: Secondary | ICD-10-CM | POA: Diagnosis present

## 2017-06-25 DIAGNOSIS — Y999 Unspecified external cause status: Secondary | ICD-10-CM | POA: Diagnosis not present

## 2017-06-25 MED ORDER — HYDROCODONE-ACETAMINOPHEN 5-325 MG PO TABS
1.0000 | ORAL_TABLET | Freq: Once | ORAL | Status: AC
  Administered 2017-06-25: 1 via ORAL
  Filled 2017-06-25: qty 1

## 2017-06-25 MED ORDER — HYDROCODONE-ACETAMINOPHEN 5-325 MG PO TABS: 1.0000 | ORAL_TABLET | Freq: Four times a day (QID) | ORAL | 0 refills | Status: DC | PRN

## 2017-06-25 NOTE — ED Notes (Signed)
Ortho paged and on the way 

## 2017-06-25 NOTE — Progress Notes (Signed)
Orthopedic Tech Progress Note Patient Details:  Corky MullKimberly N Licciardi 1975-07-09 [id number]  Ortho Devices Type of Ortho Device: Finger splint Ortho Device/Splint Interventions: Application   Post Interventions Patient Tolerated: Well Instructions Provided: Care of device   Saul FordyceJennifer C Diamante Truszkowski 06/25/2017, 4:19 PM

## 2017-06-25 NOTE — ED Provider Notes (Addendum)
[facility] Provider Note   CSN: [id number] Arrival date & time: 06/25/17  1349     History   Chief Complaint Chief Complaint  Patient presents with  . Finger Injury    HPI Corky MullKimberly N Messina is a 42 y.o. female.  HPI   42 year old female who punched a wall.  Pain to the right hand mostly in the fifth digit.  Patient has ability to range finger but with pain. Past Medical History:  Diagnosis Date  . Asthma   . Bronchitis     Patient Active Problem List   Diagnosis Date Noted  . 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 Anxiety in acute stress reaction 01/10/2014  . Peritonsillar abscess 04/19/2013    Past Surgical History:  Procedure Laterality Date  . CHOLECYSTECTOMY    . TONSILLECTOMY Bilateral 04/19/2013   Procedure: TONSILLECTOMY;  Surgeon: Christia Readingwight Bates, MD;  [facility];  Service: ENT;  Laterality: Bilateral;  . TUBAL LIGATION    . tubular ligation      OB History    No data available       Home Medications    Prior to Admission medications   Medication Sig Start Date End Date Taking? Authorizing Provider  azithromycin (ZITHROMAX) 250 MG tablet Take 1 tablet (250 mg total) by mouth daily. Take first 2 tablets together, then 1 every day until finished. 04/21/17   Geoffery Lyonselo, Douglas, MD  benzonatate (TESSALON) 100 MG capsule Take 1 capsule (100 mg total) by mouth every 8 (eight) hours. 04/13/17   Rise MuLeaphart, Kenneth T, PA-C  cefdinir (OMNICEF) 300 MG capsule Take 1 capsule (300 mg total) by mouth 2 (two) times daily. 06/25/14   Rodolph Bongorey, Evan S, MD  chlorpheniramine-HYDROcodone [facility](TUSSIONEX PENNKINETIC ER) 10-8 MG/5ML LQCR Take 5 mLs by mouth every 12 (twelve) hours as needed for cough (and pain). 07/03/14   Trixie DredgeWest, Emily, PA-C  fluticasone (FLONASE) 50 MCG/ACT nasal spray Place 2 sprays into both nostrils daily. 04/13/17   Rise MuLeaphart, Kenneth T, PA-C  ibuprofen (ADVIL,MOTRIN) 800 MG tablet Take 1 tablet (800 mg total) by mouth 3 (three) times  daily. 01/25/14   Reuben LikesKeller, David C, MD  ipratropium (ATROVENT) 0.06 % nasal spray Place 2 sprays into both nostrils 4 (four) times daily. 06/25/14   Rodolph Bongorey, Evan S, MD  predniSONE (DELTASONE) 20 MG tablet 3 tabs po day one, then 2 tabs daily x 4 days 07/03/14   West, Emily, PA-C  promethazine (PHENERGAN) 25 MG tablet Take 1 tablet (25 mg total) by mouth every 6 (six) hours as needed for nausea or vomiting. 11/22/13 06/25/14  Junious SilkMerrell, Hannah, PA-C    Family History Family History  Problem Relation Age of Onset  . Hypertension Mother   . Cancer Father     Social History Social History   Tobacco Use  . Smoking status: Never Smoker  . Smokeless tobacco: Never Used  Substance Use Topics  . Alcohol use: No  . Drug use: No     Allergies   Shellfish allergy and Asa [aspirin]   Review of Systems Review of Systems  Constitutional: Negative for activity change.  Respiratory: Negative for cough.   Cardiovascular: Negative for chest pain.  Musculoskeletal: Negative for joint swelling.  Skin: Negative for rash.  All other systems reviewed and are negative.    Physical Exam Updated Vital Signs BP (!) 148/93 (BP Location: Right Arm)   Pulse 85   Temp 98.7 F (37.1 C) (Oral)   Resp 16   Ht  5\' 7"  (1.702 m)   Wt 127 kg (280 lb)   LMP 06/16/2017   SpO2 100%   BMI 43.85 kg/m   Physical Exam  Constitutional: She is oriented to person, place, and time. She appears well-developed and well-nourished.  HENT:  Head: Normocephalic and atraumatic.  Eyes: Right eye exhibits no discharge. Left eye exhibits no discharge.  Cardiovascular: Normal rate.  Musculoskeletal:  Tenderness to the right fifth mid phalanx.  As well as bruising and tenderness to the right metacarpal.  Swelling, bruising.  Neurological: She is oriented to person, place, and time.  Skin: Skin is warm and dry. She is not diaphoretic.  Psychiatric: She has a normal mood and affect.  Nursing note and vitals  reviewed.    ED Treatments / Results  Labs (all labs ordered are listed, but only abnormal results are displayed) Labs Reviewed - No data to display  EKG  EKG Interpretation None       Radiology Dg Finger Little Right  Result Date: 06/25/2017 CLINICAL DATA:  Injury after punching a wall.  Initial encounter. EXAM: RIGHT LITTLE FINGER 2+V COMPARISON:  None. FINDINGS: There is a mildly displaced oblique fracture through the mid phalanx of the fifth digit with overlying soft tissue swelling. Fracture line extends to the articular surfaces. No evidence for associated acute fractures. IMPRESSION: Mildly displaced oblique fracture through the mid phalanx of the fifth digit with overlying soft tissue swelling. Electronically Signed   By: Annia Belt M.D.   On: 06/25/2017 15:10    Procedures Procedures (including critical care time)  SPLINT APPLICATION Date/Time: 3:29 PM Authorized by: Arlana Hove Consent: Verbal consent obtained. Risks and benefits: risks, benefits and alternatives were discussed Consent given by: patient Splint applied by: orthopedic technician Location details: R hand Splint type: finger Post-procedure: The splinted body part was neurovascularly unchanged following the procedure. Patient tolerance: Patient tolerated the procedure well with no immediate complications.     Medications Ordered in ED Medications - No data to display   Initial Impression / Assessment and Plan / ED Course  I have reviewed the triage vital signs and the nursing notes.  Pertinent labs & imaging results that were available during my care of the patient were reviewed by me and considered in my medical decision making (see chart for details).    42 year old female who punched a wall.  Pain to the right hand mostly in the fifth digit.  Bruising of the right metacarpal. Thought that there would be a metacarpal fracture,given bruising and mechanism.  However x-rays negative.   There is a fracture at the mid phalanx.  Will put in splint and follow-up with surgery.  Final Clinical Impressions(s) / ED Diagnoses   Final diagnoses:  None    ED Discharge Orders    None       Abelino Derrick, MD 06/25/17 1530    Abelino Derrick, MD 06/25/17 [phone]

## 2017-06-25 NOTE — ED Triage Notes (Signed)
Pt punched wall with R hand and now has deformity to R pinky.

## 2017-06-25 NOTE — Discharge Instructions (Signed)
Please follow-up with hand surgery, Dr. Jena GaussHaddix.  Please wear splint until you have follow-up.  Please return with any concerns.\  You may use ibuprofen and Tylenol to help with discomfort.

## 2017-06-25 NOTE — ED Notes (Signed)
Pt verbalized understanding of d/c instructions and has no further questions, VSS, NAD.  

## 2017-06-25 NOTE — ED Notes (Signed)
Ortho tech paged  

## 2017-06-29 ENCOUNTER — Other Ambulatory Visit: Payer: Self-pay | Admitting: Orthopedic Surgery

## 2017-06-29 ENCOUNTER — Encounter (HOSPITAL_BASED_OUTPATIENT_CLINIC_OR_DEPARTMENT_OTHER): Payer: Self-pay | Admitting: *Deleted

## 2017-06-29 ENCOUNTER — Other Ambulatory Visit: Payer: Self-pay

## 2017-07-06 ENCOUNTER — Ambulatory Visit (HOSPITAL_BASED_OUTPATIENT_CLINIC_OR_DEPARTMENT_OTHER): Payer: BLUE CROSS/BLUE SHIELD | Admitting: Anesthesiology

## 2017-07-06 ENCOUNTER — Other Ambulatory Visit: Payer: Self-pay

## 2017-07-06 ENCOUNTER — Ambulatory Visit (HOSPITAL_BASED_OUTPATIENT_CLINIC_OR_DEPARTMENT_OTHER)
Admission: RE | Admit: 2017-07-06 | Discharge: 2017-07-06 | Disposition: A | Discharge status: Home / Self Care | Payer: BLUE CROSS/BLUE SHIELD | Source: Ambulatory Visit | Attending: Orthopedic Surgery | Admitting: Orthopedic Surgery

## 2017-07-06 ENCOUNTER — Encounter (HOSPITAL_BASED_OUTPATIENT_CLINIC_OR_DEPARTMENT_OTHER): Payer: Self-pay

## 2017-07-06 ENCOUNTER — Encounter (HOSPITAL_BASED_OUTPATIENT_CLINIC_OR_DEPARTMENT_OTHER)
Admission: RE | Disposition: A | Discharge status: Home / Self Care | Payer: Self-pay | Source: Ambulatory Visit | Attending: Orthopedic Surgery

## 2017-07-06 DIAGNOSIS — X58XXXA Exposure to other specified factors, initial encounter: Secondary | ICD-10-CM | POA: Insufficient documentation

## 2017-07-06 DIAGNOSIS — F419 Anxiety disorder, unspecified: Secondary | ICD-10-CM | POA: Diagnosis not present

## 2017-07-06 DIAGNOSIS — S62626A Displaced fracture of medial phalanx of right little finger, initial encounter for closed fracture: Secondary | ICD-10-CM | POA: Insufficient documentation

## 2017-07-06 DIAGNOSIS — J45909 Unspecified asthma, uncomplicated: Secondary | ICD-10-CM | POA: Diagnosis not present

## 2017-07-06 DIAGNOSIS — Z91013 Allergy to seafood: Secondary | ICD-10-CM | POA: Insufficient documentation

## 2017-07-06 DIAGNOSIS — Z6841 Body Mass Index (BMI) 40.0 and over, adult: Secondary | ICD-10-CM | POA: Diagnosis not present

## 2017-07-06 DIAGNOSIS — Z886 Allergy status to analgesic agent status: Secondary | ICD-10-CM | POA: Insufficient documentation

## 2017-07-06 HISTORY — DX: Personal history of other diseases of the respiratory system: Z87.09

## 2017-07-06 HISTORY — DX: Anemia, unspecified: D64.9

## 2017-07-06 HISTORY — PX: CLOSED REDUCTION FINGER WITH PERCUTANEOUS PINNING: SHX5612

## 2017-07-06 HISTORY — DX: Displaced fracture of middle phalanx of other finger, initial encounter for closed fracture: S62.628A

## 2017-07-06 HISTORY — DX: Migraine, unspecified, not intractable, without status migrainosus: G43.909

## 2017-07-06 LAB — POCT PREGNANCY, URINE: Preg Test, Ur: NEGATIVE

## 2017-07-06 SURGERY — CLOSED REDUCTION, FINGER, WITH PERCUTANEOUS PINNING
Anesthesia: General | Site: Finger | Laterality: Right

## 2017-07-06 MED ORDER — HYDROCODONE-ACETAMINOPHEN 5-325 MG PO TABS: ORAL_TABLET | ORAL | 0 refills | Status: DC

## 2017-07-06 MED ORDER — LACTATED RINGERS IV SOLN
INTRAVENOUS | Status: DC
  Administered 2017-07-06 (×2): via INTRAVENOUS

## 2017-07-06 MED ORDER — ONDANSETRON HCL 4 MG/2ML IJ SOLN
INTRAMUSCULAR | Status: DC | PRN
  Administered 2017-07-06: 4 mg via INTRAVENOUS

## 2017-07-06 MED ORDER — FENTANYL CITRATE (PF) 100 MCG/2ML IJ SOLN
25.0000 ug | INTRAMUSCULAR | Status: DC | PRN
  Administered 2017-07-06 (×2): 25 ug via INTRAVENOUS
  Administered 2017-07-06: 50 ug via INTRAVENOUS

## 2017-07-06 MED ORDER — LIDOCAINE HCL (CARDIAC) 20 MG/ML IV SOLN
INTRAVENOUS | Status: DC | PRN
  Administered 2017-07-06: 30 mg via INTRAVENOUS

## 2017-07-06 MED ORDER — PROPOFOL 10 MG/ML IV BOLUS
INTRAVENOUS | Status: DC | PRN
  Administered 2017-07-06: 200 mg via INTRAVENOUS

## 2017-07-06 MED ORDER — LIDOCAINE HCL (CARDIAC) 20 MG/ML IV SOLN
INTRAVENOUS | Status: AC
  Filled 2017-07-06: qty 5

## 2017-07-06 MED ORDER — CHLORHEXIDINE GLUCONATE 4 % EX LIQD: 60.0000 mL | Freq: Once | CUTANEOUS | Status: DC

## 2017-07-06 MED ORDER — FENTANYL CITRATE (PF) 100 MCG/2ML IJ SOLN
INTRAMUSCULAR | Status: AC
  Filled 2017-07-06: qty 2

## 2017-07-06 MED ORDER — KETOROLAC TROMETHAMINE 30 MG/ML IJ SOLN
INTRAMUSCULAR | Status: DC | PRN
  Administered 2017-07-06: 30 mg via INTRAVENOUS

## 2017-07-06 MED ORDER — PROPOFOL 10 MG/ML IV BOLUS
INTRAVENOUS | Status: AC
  Filled 2017-07-06: qty 20

## 2017-07-06 MED ORDER — CEFAZOLIN SODIUM-DEXTROSE 2-4 GM/100ML-% IV SOLN: 2.0000 g | INTRAVENOUS | Status: DC

## 2017-07-06 MED ORDER — FENTANYL CITRATE (PF) 100 MCG/2ML IJ SOLN
INTRAMUSCULAR | Status: DC | PRN
  Administered 2017-07-06: 100 ug via INTRAVENOUS
  Administered 2017-07-06 (×2): 25 ug via INTRAVENOUS

## 2017-07-06 MED ORDER — DEXAMETHASONE SODIUM PHOSPHATE 10 MG/ML IJ SOLN
INTRAMUSCULAR | Status: DC | PRN
  Administered 2017-07-06: 10 mg via INTRAVENOUS

## 2017-07-06 MED ORDER — DEXAMETHASONE SODIUM PHOSPHATE 10 MG/ML IJ SOLN
INTRAMUSCULAR | Status: AC
  Filled 2017-07-06: qty 1

## 2017-07-06 MED ORDER — PROMETHAZINE HCL 25 MG/ML IJ SOLN: 6.2500 mg | INTRAMUSCULAR | Status: DC | PRN

## 2017-07-06 MED ORDER — KETOROLAC TROMETHAMINE 30 MG/ML IJ SOLN
INTRAMUSCULAR | Status: AC
  Filled 2017-07-06: qty 1

## 2017-07-06 MED ORDER — SCOPOLAMINE 1 MG/3DAYS TD PT72: 1.0000 | MEDICATED_PATCH | Freq: Once | TRANSDERMAL | Status: DC | PRN

## 2017-07-06 MED ORDER — MIDAZOLAM HCL 2 MG/2ML IJ SOLN
INTRAMUSCULAR | Status: AC
  Filled 2017-07-06: qty 2

## 2017-07-06 MED ORDER — FENTANYL CITRATE (PF) 100 MCG/2ML IJ SOLN: 50.0000 ug | INTRAMUSCULAR | Status: DC | PRN

## 2017-07-06 MED ORDER — DEXTROSE 5 % IV SOLN
INTRAVENOUS | Status: DC | PRN
  Administered 2017-07-06: 3 g via INTRAVENOUS

## 2017-07-06 MED ORDER — MIDAZOLAM HCL 2 MG/2ML IJ SOLN: 1.0000 mg | INTRAMUSCULAR | Status: DC | PRN

## 2017-07-06 MED ORDER — ONDANSETRON HCL 4 MG/2ML IJ SOLN
INTRAMUSCULAR | Status: AC
  Filled 2017-07-06: qty 2

## 2017-07-06 MED ORDER — MIDAZOLAM HCL 5 MG/5ML IJ SOLN
INTRAMUSCULAR | Status: DC | PRN
  Administered 2017-07-06: 2 mg via INTRAVENOUS

## 2017-07-06 MED ORDER — CEFAZOLIN SODIUM-DEXTROSE 2-4 GM/100ML-% IV SOLN
INTRAVENOUS | Status: AC
  Filled 2017-07-06: qty 100

## 2017-07-06 MED ORDER — CEFAZOLIN SODIUM-DEXTROSE 1-4 GM/50ML-% IV SOLN
INTRAVENOUS | Status: AC
  Filled 2017-07-06: qty 50

## 2017-07-06 MED ORDER — BUPIVACAINE HCL (PF) 0.25 % IJ SOLN
INTRAMUSCULAR | Status: DC | PRN
  Administered 2017-07-06: 10 mL

## 2017-07-06 SURGICAL SUPPLY — 42 items
BANDAGE ACE 3X5.8 VEL STRL LF (GAUZE/BANDAGES/DRESSINGS) ×3 IMPLANT
BLADE SURG 15 STRL LF DISP TIS (BLADE) ×2 IMPLANT
BLADE SURG 15 STRL SS (BLADE) ×4
BNDG ELASTIC 2X5.8 VLCR STR LF (GAUZE/BANDAGES/DRESSINGS) IMPLANT
BNDG ESMARK 4X9 LF (GAUZE/BANDAGES/DRESSINGS) ×3 IMPLANT
BNDG GAUZE ELAST 4 BULKY (GAUZE/BANDAGES/DRESSINGS) ×3 IMPLANT
CHLORAPREP W/TINT 26ML (MISCELLANEOUS) ×3 IMPLANT
CORD BIPOLAR FORCEPS 12FT (ELECTRODE) IMPLANT
COVER BACK TABLE 60X90IN (DRAPES) ×3 IMPLANT
COVER MAYO STAND STRL (DRAPES) ×3 IMPLANT
CUFF TOURNIQUET SINGLE 18IN (TOURNIQUET CUFF) ×3 IMPLANT
DRAPE EXTREMITY T 121X128X90 (DRAPE) ×3 IMPLANT
DRAPE OEC MINIVIEW 54X84 (DRAPES) ×3 IMPLANT
DRAPE SURG 17X23 STRL (DRAPES) ×3 IMPLANT
GAUZE SPONGE 4X4 12PLY STRL (GAUZE/BANDAGES/DRESSINGS) ×3 IMPLANT
GAUZE XEROFORM 1X8 LF (GAUZE/BANDAGES/DRESSINGS) ×3 IMPLANT
GLOVE BIO SURGEON STRL SZ7.5 (GLOVE) ×6 IMPLANT
GLOVE BIOGEL M STRL SZ7.5 (GLOVE) ×3 IMPLANT
GLOVE BIOGEL PI IND STRL 8 (GLOVE) ×2 IMPLANT
GLOVE BIOGEL PI IND STRL 8.5 (GLOVE) IMPLANT
GLOVE BIOGEL PI INDICATOR 8 (GLOVE) ×4
GLOVE BIOGEL PI INDICATOR 8.5 (GLOVE)
GLOVE SURG ORTHO 8.0 STRL STRW (GLOVE) IMPLANT
GOWN STRL REUS W/ TWL LRG LVL3 (GOWN DISPOSABLE) ×1 IMPLANT
GOWN STRL REUS W/TWL LRG LVL3 (GOWN DISPOSABLE) ×2
K-WIRE .035X4 (WIRE) ×3 IMPLANT
NEEDLE HYPO 25X1 1.5 SAFETY (NEEDLE) ×3 IMPLANT
NS IRRIG 1000ML POUR BTL (IV SOLUTION) ×3 IMPLANT
PACK BASIN DAY SURGERY FS (CUSTOM PROCEDURE TRAY) ×3 IMPLANT
PAD CAST 3X4 CTTN HI CHSV (CAST SUPPLIES) IMPLANT
PAD CAST 4YDX4 CTTN HI CHSV (CAST SUPPLIES) IMPLANT
PADDING CAST COTTON 3X4 STRL (CAST SUPPLIES)
PADDING CAST COTTON 4X4 STRL (CAST SUPPLIES)
SLEEVE SCD COMPRESS KNEE MED (MISCELLANEOUS) ×3 IMPLANT
SPLINT FINGER 4.25 BULB 911906 (SOFTGOODS) ×3 IMPLANT
STOCKINETTE 4X48 STRL (DRAPES) ×3 IMPLANT
SUT ETHILON 3 0 PS 1 (SUTURE) IMPLANT
SUT ETHILON 4 0 PS 2 18 (SUTURE) IMPLANT
SYR BULB 3OZ (MISCELLANEOUS) IMPLANT
SYR CONTROL 10ML LL (SYRINGE) IMPLANT
TOWEL OR 17X24 6PK STRL BLUE (TOWEL DISPOSABLE) ×6 IMPLANT
UNDERPAD 30X30 (UNDERPADS AND DIAPERS) ×3 IMPLANT

## 2017-07-06 NOTE — Anesthesia Preprocedure Evaluation (Signed)
Anesthesia Evaluation  Patient identified by MRN, date of birth, ID band Patient awake    Reviewed: Allergy & Precautions, NPO status , Patient's Chart, lab work & pertinent test results  Airway Mallampati: II  TM Distance: >3 FB Neck ROM: Full    Dental  (+) Dental Advisory Given, Partial Upper   Pulmonary asthma ,    Pulmonary exam normal breath sounds clear to auscultation       Cardiovascular Exercise Tolerance: Good negative cardio ROS Normal cardiovascular exam Rhythm:Regular Rate:Normal     Neuro/Psych  Headaches, PSYCHIATRIC DISORDERS Anxiety    GI/Hepatic negative GI ROS, Neg liver ROS,   Endo/Other  Morbid obesity  Renal/GU negative Renal ROS     Musculoskeletal negative musculoskeletal ROS (+)   Abdominal   Peds  Hematology  (+) Blood dyscrasia, anemia ,   Anesthesia Other Findings Day of surgery medications reviewed with the patient.  Reproductive/Obstetrics negative OB ROS                             Anesthesia Physical Anesthesia Plan  ASA: III  Anesthesia Plan: General   Post-op Pain Management:    Induction: Intravenous  PONV Risk Score and Plan: 3 and Dexamethasone, Ondansetron, Treatment may vary due to age or medical condition and Midazolam  Airway Management Planned: LMA  Additional Equipment:   Intra-op Plan:   Post-operative Plan: Extubation in OR  Informed Consent: I have reviewed the patients History and Physical, chart, labs and discussed the procedure including the risks, benefits and alternatives for the proposed anesthesia with the patient or authorized representative who has indicated his/her understanding and acceptance.   Dental advisory given  Plan Discussed with: CRNA  Anesthesia Plan Comments:         Anesthesia Quick Evaluation

## 2017-07-06 NOTE — H&P (Signed)
  Carla Barton is an 42 y.o. female.   Chief Complaint: right small finger fracture HPI: 42 yo female states she injured right small finger 10 days ago.  Seen in ED where XR revealed small finger middle phalanx fracture.  Splinted and followed up in office.  She wishes to undergo operative fixation.  Allergies:  Allergies  Allergen Reactions  . Shellfish Allergy Shortness Of Breath and Swelling  . Asa [Aspirin] Other (See Comments)    CAUSES ASTHMA EXACERBATION    Past Medical History:  Diagnosis Date  . Anemia    no current med.  . Closed fracture of middle phalanx of little finger 06/2017   right  . History of asthma    no current med.  Marland Kitchen. Migraines     Past Surgical History:  Procedure Laterality Date  . CHOLECYSTECTOMY    . TONSILLECTOMY Bilateral 04/19/2013   Procedure: TONSILLECTOMY;  Surgeon: Christia Readingwight Bates, MD;  [facility];  Service: ENT;  Laterality: Bilateral;  . TUBAL LIGATION      Family History: Family History  Problem Relation Age of Onset  . Hypertension Mother   . Cancer Father     Social History:   reports that she has never smoked. She has never used smokeless tobacco. She reports that she does not drink alcohol or use drugs.  Medications: No medications prior to admission.    No results found for this or any previous visit (from the past 48 hour(s)).  No results found.   A comprehensive review of systems was negative.  Height 5\' 7"  (1.702 m), weight 127 kg (280 lb), last menstrual period 06/16/2017.  General appearance: alert, cooperative and appears stated age Head: Normocephalic, without obvious abnormality, atraumatic Neck: supple, symmetrical, trachea midline Cardio: regular rate and rhythm Resp: clear to auscultation bilaterally Extremities: Intact sensation and capillary refill all digits.  +epl/fpl/io.  No wounds.  Pulses: 2+ and symmetric Skin: Skin color, texture, turgor normal. No rashes or lesions Neurologic: Grossly  normal Incision/Wound: none  Assessment/Plan Right small finger middle phalanx fracture.  Non operative and operative treatment options were discussed with the patient and patient wishes to proceed with operative treatment. Risks, benefits, and alternatives of surgery were discussed and the patient agrees with the plan of care.   Patrisha Hausmann R 07/06/2017, 9:36 AM

## 2017-07-06 NOTE — Anesthesia Postprocedure Evaluation (Signed)
Anesthesia Post Note  Patient: Corky MullKimberly N Meder  Procedure(s) Performed: CLOSED REDUCTION WITH PERCUTANEOUS PINNING RIGHT SMALL FINGER FRACTURE (Right Finger)     Patient location during evaluation: PACU Anesthesia Type: General Level of consciousness: awake and alert Pain management: pain level controlled Vital Signs Assessment: post-procedure vital signs reviewed and stable Respiratory status: spontaneous breathing, nonlabored ventilation, respiratory function stable and patient connected to nasal cannula oxygen Cardiovascular status: blood pressure returned to baseline and stable Postop Assessment: no apparent nausea or vomiting Anesthetic complications: no    Last Vitals:  Vitals:   07/06/17 1700 07/06/17 1730  BP: (!) 126/99 136/87  Pulse: 87 68  Resp: 15 16  Temp:  36.6 C  SpO2: 100% 100%    Last Pain:  Vitals:   07/06/17 1715  TempSrc:   PainSc: Asleep                 Cecile HearingStephen Edward Glendon Dunwoody

## 2017-07-06 NOTE — Transfer of Care (Signed)
Immediate Anesthesia Transfer of Care Note  Patient: Carla Barton  Procedure(s) Performed: CLOSED REDUCTION WITH PERCUTANEOUS PINNING RIGHT SMALL FINGER FRACTURE (Right Finger)  Patient Location: PACU  Anesthesia Type:General  Level of Consciousness: awake and sedated  Airway & Oxygen Therapy: Patient Spontanous Breathing and Patient connected to face mask oxygen  Post-op Assessment: Report given to RN and Post -op Vital signs reviewed and stable  Post vital signs: Reviewed and stable  Last Vitals:  Vitals Value Taken Time  BP    Temp    Pulse 109 07/06/2017  4:12 PM  Resp    SpO2 99 % 07/06/2017  4:12 PM  Vitals shown include unvalidated device data.  Last Pain:  Vitals:   07/06/17 1315  TempSrc: Oral  PainSc: 5       Patients Stated Pain Goal: 4 (07/06/17 1315)  Complications: No apparent anesthesia complications

## 2017-07-06 NOTE — Op Note (Signed)
[id number] 

## 2017-07-06 NOTE — Anesthesia Procedure Notes (Signed)
Procedure Name: LMA Insertion Date/Time: 07/06/2017 3:34 PM Performed by: [place] DesanctisLinka, Jedaiah Rathbun L, CRNA Pre-anesthesia Checklist: Patient identified, Emergency Drugs available, Suction available, Patient being monitored and Timeout performed Patient Re-evaluated:Patient Re-evaluated prior to induction Oxygen Delivery Method: Circle system utilized Preoxygenation: Pre-oxygenation with 100% oxygen Induction Type: IV induction Ventilation: Mask ventilation without difficulty LMA: LMA inserted LMA Size: 4.0 Number of attempts: 1 Airway Equipment and Method: Bite block Placement Confirmation: positive ETCO2 Tube secured with: Tape Dental Injury: Teeth and Oropharynx as per pre-operative assessment

## 2017-07-06 NOTE — Discharge Instructions (Addendum)

## 2017-07-06 NOTE — Brief Op Note (Signed)
07/06/2017  4:09 PM  PATIENT:  Carla Barton  42 y.o. female  PRE-OPERATIVE DIAGNOSIS:  RIGHT SMALL FINGER MIDDLE PHALANX FRACTURE  POST-OPERATIVE DIAGNOSIS:  RIGHT SMALL FINGER MIDDLE PHALANX FRACTURE  PROCEDURE:  Procedure(s): CLOSED REDUCTION WITH PERCUTANEOUS PINNING RIGHT SMALL FINGER FRACTURE (Right)  SURGEON:  Surgeon(s) and Role:    * Betha LoaKuzma, Branae Crail, MD - Primary  PHYSICIAN ASSISTANT:   ASSISTANTS: none   ANESTHESIA:   general  EBL:  3 mL   BLOOD ADMINISTERED:none  DRAINS: none   LOCAL MEDICATIONS USED:  MARCAINE     SPECIMEN:  No Specimen  DISPOSITION OF SPECIMEN:  N/A  COUNTS:  YES  TOURNIQUET:   Total Tourniquet Time Documented: Upper Arm (Right) - 15 minutes Total: Upper Arm (Right) - 15 minutes   DICTATION: .Other Dictation: Dictation Number [id number]  PLAN OF CARE: Discharge to home after PACU  PATIENT DISPOSITION:  PACU - hemodynamically stable.

## 2017-07-07 ENCOUNTER — Encounter (HOSPITAL_BASED_OUTPATIENT_CLINIC_OR_DEPARTMENT_OTHER): Payer: Self-pay | Admitting: Orthopedic Surgery

## 2017-07-07 NOTE — Op Note (Signed)
NAMJamie Barton:  Hawkes, Stehanie               ACCOUNT NO.:  [id number]  MEDICAL RECORD NO.:  [id number]  LOCATION:                                 FACILITY:  PHYSICIAN:  Betha LoaKevin Rielynn Trulson, MD        DATE OF BIRTH:  October 11, 1975  DATE OF PROCEDURE:  07/06/2017 DATE OF DISCHARGE:                              OPERATIVE REPORT   PREOPERATIVE DIAGNOSIS:  Right small finger middle phalanx fracture.  POSTOPERATIVE DIAGNOSIS:  Right small finger middle phalanx fracture.  PROCEDURE:  Closed reduction and pin fixation of right small finger middle phalanx fracture.  SURGEON:  Betha LoaKevin Jaelene Garciagarcia, MD.  ASSISTANT:  None.  ANESTHESIA:  General.  IV FLUIDS:  Per anesthesia flow sheet.  ESTIMATED BLOOD LOSS:  Minimal.  COMPLICATIONS:  None.  SPECIMENS:  None.  TOURNIQUET TIME:  15 minutes.  DISPOSITION:  Stable to PACU.  INDICATIONS:  Ms. [name] is a 42 year old female who sustained an injury to the right small finger.  She was seen in the emergency department where radiographs were taken revealing a middle phalanx fracture.  She was splinted and followed up in the office.  We discussed nonoperative and operative treatment options.  She wished to proceed with operative fixation.  Risks, benefits, and alternatives of surgery were discussed including the risks of blood loss, infection, damage to nerves, vessels, tendons, ligaments, and bone, failure of surgery, need for additional surgery, complications with wound healing, continued pain, nonunion, malunion, and stiffness.  She voiced understanding of these risks and elected to proceed.  OPERATIVE COURSE:  After being identified preoperatively by myself, the patient and I agreed upon the procedure and site of procedure.  Surgical site was marked.  Risks, benefits, and alternatives of surgery were reviewed and she wished to proceed.  Surgical consent had been signed. She was given IV Ancef as preoperative antibiotic prophylaxis.  DESCRIPTION OF PROCEDURE:   She was transferred to the operating room and placed on the operating table in supine position with the right upper extremity on an arm board.  General anesthesia was induced by anesthesiologist.  Right upper extremity was prepped and draped in a normal sterile orthopedic fashion.  Surgical pause was performed between surgeons, Anesthesia, and operating room staff; and all were in agreement as to the patient, procedure, and site of procedure. Tourniquet at the proximal aspect of the extremity was inflated to 250 mmHg after exsanguination of the limb with an Esmarch bandage.  C-arm was used in AP and lateral projections.  A closed reduction of the right small finger middle phalanx fracture was performed.  This provided adequate reduction.  Two 0.035-inch K-wires were used.  One was advanced from the tip of the finger across the DIP joint and fracture site.  The second was advanced obliquely across the fracture site.  This was adequate to provide stabilization to the fracture.  C-arm was used in AP and lateral projections to ensure appropriate reduction and position of hardware which was the case.  The wrist was placed through tenodesis and it appeared that there would be good alignment of the digit.  The pins were bent and cut short.  The pin sites were dressed with  sterile Xeroform, 4 x 4 and wrapped with a Coban dressing lightly.  An AlumaFoam splint was placed and wrapped lightly with Coban dressing.  Tourniquet was deflated at 15 minutes.  Fingertips were pink with brisk capillary refill after deflation of tourniquet.  The operative drapes were broken down and the patient was awoken from anesthesia safely.  She was transferred back to stretcher and taken to PACU in stable condition.  I will see her back in the office in 1 week for postoperative followup.  I will give her Norco 5/325 one to two p.o. q.6 hours p.r.n. pain, dispensed #20.     Betha Loa, MD     KK/MEDQ  D:   07/06/2017  T:  07/06/2017  Job:  [id number]

## 2017-09-06 LAB — HM PAP SMEAR: HM Pap smear: ABNORMAL

## 2017-10-09 LAB — HM COLONOSCOPY

## 2018-04-27 ENCOUNTER — Other Ambulatory Visit: Payer: Self-pay

## 2018-04-27 ENCOUNTER — Emergency Department (HOSPITAL_COMMUNITY)
Admission: EM | Admit: 2018-04-27 | Discharge: 2018-04-27 | Disposition: A | Discharge status: Home / Self Care | Payer: BLUE CROSS/BLUE SHIELD | Attending: Emergency Medicine | Admitting: Emergency Medicine

## 2018-04-27 ENCOUNTER — Encounter (HOSPITAL_COMMUNITY): Payer: Self-pay | Admitting: Emergency Medicine

## 2018-04-27 ENCOUNTER — Emergency Department (HOSPITAL_COMMUNITY): Payer: BLUE CROSS/BLUE SHIELD

## 2018-04-27 DIAGNOSIS — S63697A Other sprain of left little finger, initial encounter: Secondary | ICD-10-CM

## 2018-04-27 DIAGNOSIS — W228XXA Striking against or struck by other objects, initial encounter: Secondary | ICD-10-CM | POA: Insufficient documentation

## 2018-04-27 DIAGNOSIS — S6992XA Unspecified injury of left wrist, hand and finger(s), initial encounter: Secondary | ICD-10-CM | POA: Diagnosis present

## 2018-04-27 DIAGNOSIS — Y929 Unspecified place or not applicable: Secondary | ICD-10-CM | POA: Diagnosis not present

## 2018-04-27 DIAGNOSIS — Y939 Activity, unspecified: Secondary | ICD-10-CM | POA: Diagnosis not present

## 2018-04-27 DIAGNOSIS — Y990 Civilian activity done for income or pay: Secondary | ICD-10-CM | POA: Insufficient documentation

## 2018-04-27 NOTE — Discharge Instructions (Addendum)
Take Tylenol and ibuprofen as needed for pain. Wear finger splint as directed. Return to ED for worsening symptoms, increased swelling, redness or signs of infection.

## 2018-04-27 NOTE — ED Triage Notes (Signed)
Pt c/o left 5th finger pain that gotten worse after the past 2 days. Denies injuries or falls.

## 2018-04-27 NOTE — ED Provider Notes (Signed)
[facility]-EMERGENCY DEPT Provider Note   CSN: [id number] Arrival date & time: 04/27/18  1738     History   Chief Complaint Chief Complaint  Patient presents with  . Hand Pain    left     HPI Carla Barton is a 43 y.o. female who presents to ED for evaluation of left fifth digit pain after possible injury at work.  She feels that she may have jammed her finger on something at work.  She has had progressive worsening of her pain but has not taken any medications at home.  She denies any changes to sensation or swelling.  HPI  Past Medical History:  Diagnosis Date  . Anemia    no current med.  . Closed fracture of middle phalanx of little finger 06/2017   right  . History of asthma    no current med.  . Migraines     Patient Active Problem List   Diagnosis Date Noted  . 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 Anxiety in acute stress reaction 01/10/2014  . Peritonsillar abscess 04/19/2013    Past Surgical History:  Procedure Laterality Date  . CHOLECYSTECTOMY    . CLOSED REDUCTION FINGER WITH PERCUTANEOUS PINNING Right 07/06/2017   Procedure: CLOSED REDUCTION WITH PERCUTANEOUS PINNING RIGHT SMALL FINGER FRACTURE;  Surgeon: Betha LoaKuzma, Kevin, MD;  Location: [facility];  Service: Orthopedics;  Laterality: Right;  . TONSILLECTOMY Bilateral 04/19/2013   Procedure: TONSILLECTOMY;  Surgeon: Christia Readingwight Bates, MD;  [facility];  Service: ENT;  Laterality: Bilateral;  . TUBAL LIGATION       OB History   No obstetric history on file.      Home Medications    Prior to Admission medications   Medication Sig Start Date End Date Taking? Authorizing Provider  HYDROcodone-acetaminophen [facility](NORCO) 5-325 MG tablet 1-2 tabs po q6 hours prn pain 07/06/17   Betha LoaKuzma, Kevin, MD    Family History Family History  Problem Relation Age of Onset  . Hypertension Mother   . Cancer Father     Social History Social History   Tobacco Use  . Smoking status:  Never Smoker  . Smokeless tobacco: Never Used  Substance Use Topics  . Alcohol use: No  . Drug use: No     Allergies   Shellfish allergy and Asa [aspirin]   Review of Systems Review of Systems  Constitutional: Negative for chills and fever.  Musculoskeletal: Positive for arthralgias.  Neurological: Negative for weakness.     Physical Exam Updated Vital Signs BP (!) 130/92 (BP Location: Left Arm)   Pulse 73   Temp 98 F (36.7 C) (Oral)   Resp 18   Ht 5\' 6"  (1.676 m)   LMP 04/12/2018   SpO2 100%   BMI 45.42 kg/m   Physical Exam Vitals signs and nursing note reviewed.  Constitutional:      General: She is not in acute distress.    Appearance: She is well-developed. She is not diaphoretic.  HENT:     Head: Normocephalic and atraumatic.  Eyes:     General: No scleral icterus.    Conjunctiva/sclera: Conjunctivae normal.  Neck:     Musculoskeletal: Normal range of motion.  Pulmonary:     Effort: Pulmonary effort is normal. No respiratory distress.  Musculoskeletal:        General: Tenderness present.     Comments: Diffuse tenderness to palpation of the left fifth digit at the DIP and PIP joints.  No overlying skin  changes, edema or erythema noted.  Full active and passive range of motion of digit although pain with flexion.  Sensation intact to light touch of digits laterally and medially.  2+ radial pulse palpated on left.  Skin:    Findings: No rash.  Neurological:     Mental Status: She is alert.      ED Treatments / Results  Labs (all labs ordered are listed, but only abnormal results are displayed) Labs Reviewed - No data to display  EKG None  Radiology Dg Hand Complete Left  Result Date: 04/27/2018 CLINICAL DATA:  Pt c/o left 5th finger pain that gotten worse after the past 2 days. Denies injuries or falls. EXAM: LEFT HAND - COMPLETE 3+ VIEW COMPARISON:  None. FINDINGS: There is no evidence of fracture or dislocation. There is no evidence of  arthropathy or other focal bone abnormality. Soft tissues are unremarkable. IMPRESSION: No acute osseous injury of the left hand. Electronically Signed   By: Elige KoHetal  Patel   On: 04/27/2018 21:00    Procedures Procedures (including critical care time)  Medications Ordered in ED Medications - No data to display   Initial Impression / Assessment and Plan / ED Course  I have reviewed the triage vital signs and the nursing notes.  Pertinent labs & imaging results that were available during my care of the patient were reviewed by me and considered in my medical decision making (see chart for details).     43 year old female presents to ED for left pinky pain for the past 2 days.  She is unsure if she may have jammed it while doing work.  On my exam there are no skin changes, joint swelling or wounds noted.  She has full active and passive range of motion noted.  No deformities.  Area is neurovascularly intact.  X-rays negative for acute abnormality.  Suspect that symptoms are due to a sprain.  Will give finger splint, advised NSAIDs and orthopedic follow-up as needed.  Advised to return to ED for any severe worsening symptoms.  Patient is hemodynamically stable, in NAD, and able to ambulate in the ED. Evaluation does not show pathology that would require ongoing emergent intervention or inpatient treatment. I explained the diagnosis to the patient. Pain has been managed and has no complaints prior to discharge. Patient is comfortable with above plan and is stable for discharge at this time. All questions were answered prior to disposition. Strict return precautions for returning to the ED were discussed. Encouraged follow up with PCP.    Portions of this note were generated with Scientist, clinical (histocompatibility and immunogenetics)Dragon dictation software. Dictation errors may occur despite best attempts at proofreading.   Final Clinical Impressions(s) / ED Diagnoses   Final diagnoses:  Sprain of other site of left little finger, initial  encounter    ED Discharge Orders    None       Dietrich PatesKhatri, Verble Styron, Cordelia Poche-C 04/27/18 2109    Bethann BerkshireZammit, Joseph, MD 04/27/18 2323

## 2019-11-23 ENCOUNTER — Other Ambulatory Visit: Payer: Self-pay

## 2019-11-23 ENCOUNTER — Encounter (HOSPITAL_COMMUNITY): Payer: Self-pay | Admitting: Urgent Care

## 2019-11-23 ENCOUNTER — Ambulatory Visit (HOSPITAL_COMMUNITY)
Admission: EM | Admit: 2019-11-23 | Discharge: 2019-11-23 | Disposition: A | Discharge status: Home / Self Care | Payer: 59 | Attending: Urgent Care | Admitting: Urgent Care

## 2019-11-23 DIAGNOSIS — Z8709 Personal history of other diseases of the respiratory system: Secondary | ICD-10-CM | POA: Diagnosis present

## 2019-11-23 DIAGNOSIS — Z20822 Contact with and (suspected) exposure to covid-19: Secondary | ICD-10-CM | POA: Insufficient documentation

## 2019-11-23 DIAGNOSIS — R059 Cough, unspecified: Secondary | ICD-10-CM

## 2019-11-23 DIAGNOSIS — B349 Viral infection, unspecified: Secondary | ICD-10-CM | POA: Insufficient documentation

## 2019-11-23 DIAGNOSIS — R0981 Nasal congestion: Secondary | ICD-10-CM | POA: Diagnosis present

## 2019-11-23 DIAGNOSIS — R05 Cough: Secondary | ICD-10-CM | POA: Insufficient documentation

## 2019-11-23 MED ORDER — BENZONATATE 100 MG PO CAPS: 100.0000 mg | ORAL_CAPSULE | Freq: Three times a day (TID) | ORAL | 0 refills | Status: DC | PRN

## 2019-11-23 MED ORDER — ACETAMINOPHEN 325 MG PO TABS
650.0000 mg | ORAL_TABLET | Freq: Once | ORAL | Status: AC
  Administered 2019-11-23: 650 mg via ORAL

## 2019-11-23 MED ORDER — ALBUTEROL SULFATE HFA 108 (90 BASE) MCG/ACT IN AERS: 1.0000 | INHALATION_SPRAY | Freq: Four times a day (QID) | RESPIRATORY_TRACT | 0 refills | Status: DC | PRN

## 2019-11-23 MED ORDER — CETIRIZINE HCL 10 MG PO TABS: 10.0000 mg | ORAL_TABLET | Freq: Every day | ORAL | 0 refills | Status: DC

## 2019-11-23 MED ORDER — PROMETHAZINE-DM 6.25-15 MG/5ML PO SYRP: 5.0000 mL | ORAL_SOLUTION | Freq: Every evening | ORAL | 0 refills | Status: DC | PRN

## 2019-11-23 MED ORDER — ACETAMINOPHEN 325 MG PO TABS
ORAL_TABLET | ORAL | Status: AC
  Filled 2019-11-23: qty 2

## 2019-11-23 NOTE — Discharge Instructions (Addendum)
We will notify you of your COVID-19 test results as they arrive and may take between 24 to 48 hours.  I encourage you to sign up for MyChart if you have not already done so as this can be the easiest way for us to communicate results to you online or through a phone app.  In the meantime, if you develop worsening symptoms including fever, chest pain, shortness of breath despite our current treatment plan then please report to the emergency room as this may be a sign of worsening status from possible COVID-19 infection.  Otherwise, we will manage this as a viral syndrome. For sore throat or cough try using a honey-based tea. Use 3 teaspoons of honey with juice squeezed from half lemon. Place shaved pieces of ginger into 1/2-1 cup of water and warm over stove top. Then mix the ingredients and repeat every 4 hours as needed. Please take Tylenol 500mg-650mg every 6 hours for aches and pains, fevers. Hydrate very well with at least 2 liters of water. Eat light meals such as soups to replenish electrolytes and soft fruits, veggies. Start an antihistamine like Zyrtec for postnasal drainage, sinus congestion. 

## 2019-11-23 NOTE — ED Provider Notes (Signed)
[facility]   MRN: [id number] DOB: 1976/02/24  Subjective:   Carla Barton is a 44 y.o. female presenting for 2-day history of acute onset runny and stuffy nose, dry cough.  Patient had close exposure to her sister who tested positive for COVID-19.  Has a history of asthma, has not needed an albuterol inhaler for years.  Denies smoking cigarettes.  No current facility-administered medications for this encounter.  Current Outpatient Medications:  .  HYDROcodone-acetaminophen (NORCO) 5-325 MG tablet, 1-2 tabs po q6 hours prn pain, Disp: 20 tablet, Rfl: 0   Allergies  Allergen Reactions  . Shellfish Allergy Shortness Of Breath and Swelling  . Asa [Aspirin] Other (See Comments)    CAUSES ASTHMA EXACERBATION    Past Medical History:  Diagnosis Date  . Anemia    no current med.  . Closed fracture of middle phalanx of little finger 06/2017   right  . History of asthma    no current med.  Marland Kitchen Migraines      Past Surgical History:  Procedure Laterality Date  . CHOLECYSTECTOMY    . CLOSED REDUCTION FINGER WITH PERCUTANEOUS PINNING Right 07/06/2017   Procedure: CLOSED REDUCTION WITH PERCUTANEOUS PINNING RIGHT SMALL FINGER FRACTURE;  Surgeon: Betha Loa, MD;  Location: [facility];  Service: Orthopedics;  Laterality: Right;  . TONSILLECTOMY Bilateral 04/19/2013   Procedure: TONSILLECTOMY;  Surgeon: Christia Reading, MD;  [facility]: [facility];  Service: ENT;  Laterality: Bilateral;  . TUBAL LIGATION      Family History  Problem Relation Age of Onset  . Hypertension Mother   . Cancer Father     Social History   Tobacco Use  . Smoking status: Never Smoker  . Smokeless tobacco: Never Used  Vaping Use  . Vaping Use: Never used  Substance Use Topics  . Alcohol use: No  . Drug use: No    ROS Denies fever, loss of sense of taste smell, chest pain, shortness of breath, body aches.  Objective:   Vitals: BP (!) 127/108 (BP Location: Left Wrist)   Pulse (!)  114   Temp 100.1 F (37.8 C) (Oral)   Resp 20   LMP  (Within Weeks) Comment: 1 week  SpO2 96%   Physical Exam Constitutional:      General: She is not in acute distress.    Appearance: Normal appearance. She is well-developed. She is obese. She is not ill-appearing, toxic-appearing or diaphoretic.  HENT:     Head: Normocephalic and atraumatic.     Nose: Nose normal.     Mouth/Throat:     Mouth: Mucous membranes are moist.     Pharynx: Oropharynx is clear.  Eyes:     General: No scleral icterus.       Right eye: No discharge.        Left eye: No discharge.     Extraocular Movements: Extraocular movements intact.     Conjunctiva/sclera: Conjunctivae normal.     Pupils: Pupils are equal, round, and reactive to light.  Cardiovascular:     Rate and Rhythm: Normal rate and regular rhythm.     Pulses: Normal pulses.     Heart sounds: Normal heart sounds. No murmur heard.  No friction rub. No gallop.   Pulmonary:     Effort: Pulmonary effort is normal. No respiratory distress.     Breath sounds: Normal breath sounds. No stridor. No wheezing, rhonchi or rales.  Skin:    General: Skin is warm and dry.  Findings: No rash.  Neurological:     General: No focal deficit present.     Mental Status: She is alert and oriented to person, place, and time.  Psychiatric:        Mood and Affect: Mood normal.        Behavior: Behavior normal.        Thought Content: Thought content normal.        Judgment: Judgment normal.       Assessment and Plan :   PDMP not reviewed this encounter.  1. Viral syndrome   2. Cough   3. Nasal congestion   4. History of asthma   5. Close exposure to COVID-19 virus     High suspicion for COVID-19 given her close exposure from patient's current symptoms that with fever and borderline tachycardia.  COVID-19 testing is pending.  Will use supportive care including albuterol refill.  Patient would be a good candidate for the Covid infusion clinic  should she test positive. Counseled patient on potential for adverse effects with medications prescribed/recommended today, ER and return-to-clinic precautions discussed, patient verbalized understanding.    Wallis Bamberg, PA-C 11/23/19 1044

## 2019-11-23 NOTE — ED Triage Notes (Signed)
Pt presents for COVID testing. States her sister in law tested positive for COVID 3 days ago and she started having cough and nasal congestion 2 days ago.

## 2019-11-24 LAB — SARS CORONAVIRUS 2 (TAT 6-24 HRS): SARS Coronavirus 2: POSITIVE — AB

## 2019-11-25 ENCOUNTER — Other Ambulatory Visit: Payer: Self-pay | Admitting: Adult Health

## 2019-11-25 DIAGNOSIS — U071 COVID-19: Secondary | ICD-10-CM

## 2019-11-25 NOTE — Progress Notes (Signed)
I connected by phone with Carla Barton on 11/25/2019 at 5:11 PM to discuss the potential use of a new treatment for mild to moderate COVID-19 viral infection in non-hospitalized patients.  This patient is a 44 y.o. female that meets the FDA criteria for Emergency Use Authorization of COVID monoclonal antibody casirivimab/imdevimab.  Has a (+) direct SARS-CoV-2 viral test result  Has mild or moderate COVID-19   Is NOT hospitalized due to COVID-19  Is within 10 days of symptom onset  Has at least one of the high risk factor(s) for progression to severe COVID-19 and/or hospitalization as defined in EUA.  Specific high risk criteria : BMI > 25   I have spoken and communicated the following to the patient or parent/caregiver regarding COVID monoclonal antibody treatment:  1. FDA has authorized the emergency use for the treatment of mild to moderate COVID-19 in adults and pediatric patients with positive results of direct SARS-CoV-2 viral testing who are 81 years of age and older weighing at least 40 kg, and who are at high risk for progressing to severe COVID-19 and/or hospitalization.  2. The significant known and potential risks and benefits of COVID monoclonal antibody, and the extent to which such potential risks and benefits are unknown.  3. Information on available alternative treatments and the risks and benefits of those alternatives, including clinical trials.  4. Patients treated with COVID monoclonal antibody should continue to self-isolate and use infection control measures (e.g., wear mask, isolate, social distance, avoid sharing personal items, clean and disinfect "high touch" surfaces, and frequent handwashing) according to CDC guidelines.   5. The patient or parent/caregiver has the option to accept or refuse COVID monoclonal antibody treatment.  After reviewing this information with the patient, The patient agreed to proceed with receiving casirivimab\imdevimab infusion  and will be provided a copy of the Fact sheet prior to receiving the infusion. Carla Barton 11/25/2019 5:11 PM

## 2019-11-26 ENCOUNTER — Ambulatory Visit (HOSPITAL_COMMUNITY)
Admission: RE | Admit: 2019-11-26 | Discharge: 2019-11-26 | Disposition: A | Discharge status: Home / Self Care | Payer: 59 | Source: Ambulatory Visit | Attending: Pulmonary Disease | Admitting: Pulmonary Disease

## 2019-11-26 DIAGNOSIS — U071 COVID-19: Secondary | ICD-10-CM | POA: Diagnosis not present

## 2019-11-26 MED ORDER — EPINEPHRINE 0.3 MG/0.3ML IJ SOAJ: 0.3000 mg | Freq: Once | INTRAMUSCULAR | Status: DC | PRN

## 2019-11-26 MED ORDER — SODIUM CHLORIDE 0.9 % IV SOLN: INTRAVENOUS | Status: DC | PRN

## 2019-11-26 MED ORDER — FAMOTIDINE IN NACL 20-0.9 MG/50ML-% IV SOLN: 20.0000 mg | Freq: Once | INTRAVENOUS | Status: DC | PRN

## 2019-11-26 MED ORDER — SODIUM CHLORIDE 0.9 % IV SOLN
1200.0000 mg | Freq: Once | INTRAVENOUS | Status: AC
  Administered 2019-11-26: 1200 mg via INTRAVENOUS
  Filled 2019-11-26 (×2): qty 10

## 2019-11-26 MED ORDER — ALBUTEROL SULFATE HFA 108 (90 BASE) MCG/ACT IN AERS: 2.0000 | INHALATION_SPRAY | Freq: Once | RESPIRATORY_TRACT | Status: DC | PRN

## 2019-11-26 MED ORDER — METHYLPREDNISOLONE SODIUM SUCC 125 MG IJ SOLR: 125.0000 mg | Freq: Once | INTRAMUSCULAR | Status: DC | PRN

## 2019-11-26 MED ORDER — DIPHENHYDRAMINE HCL 50 MG/ML IJ SOLN: 50.0000 mg | Freq: Once | INTRAMUSCULAR | Status: DC | PRN

## 2019-11-26 NOTE — Progress Notes (Signed)
Patient arrived to [facility] infusion clinic for REGEN-COV. Mdd completed. VSS. AVS reviewed. D/c'ed to home.

## 2019-11-26 NOTE — Discharge Instructions (Signed)

## 2020-08-17 ENCOUNTER — Other Ambulatory Visit: Payer: Self-pay

## 2020-08-17 ENCOUNTER — Ambulatory Visit (INDEPENDENT_AMBULATORY_CARE_PROVIDER_SITE_OTHER): Payer: 59 | Admitting: Family Medicine

## 2020-08-17 ENCOUNTER — Encounter: Payer: Self-pay | Admitting: Family Medicine

## 2020-08-17 VITALS — BP 130/84 | HR 77 | Ht 67.25 in | Wt 298.4 lb

## 2020-08-17 DIAGNOSIS — D509 Iron deficiency anemia, unspecified: Secondary | ICD-10-CM | POA: Diagnosis not present

## 2020-08-17 DIAGNOSIS — Z8541 Personal history of malignant neoplasm of cervix uteri: Secondary | ICD-10-CM

## 2020-08-17 DIAGNOSIS — R630 Anorexia: Secondary | ICD-10-CM

## 2020-08-17 DIAGNOSIS — E538 Deficiency of other specified B group vitamins: Secondary | ICD-10-CM

## 2020-08-17 DIAGNOSIS — E559 Vitamin D deficiency, unspecified: Secondary | ICD-10-CM

## 2020-08-17 DIAGNOSIS — J4520 Mild intermittent asthma, uncomplicated: Secondary | ICD-10-CM

## 2020-08-17 DIAGNOSIS — R609 Edema, unspecified: Secondary | ICD-10-CM

## 2020-08-17 LAB — CBC WITH DIFFERENTIAL/PLATELET
Hematocrit: 34 % (ref 34.0–46.6)
Lymphocytes Absolute: 3 10*3/uL (ref 0.7–3.1)
MCV: 82 fL (ref 79–97)
Neutrophils Absolute: 7.2 10*3/uL — ABNORMAL HIGH (ref 1.4–7.0)
Platelets: 472 10*3/uL — ABNORMAL HIGH (ref 150–450)
RBC: 4.16 x10E6/uL (ref 3.77–5.28)

## 2020-08-17 LAB — COMPREHENSIVE METABOLIC PANEL
AST: 13 IU/L (ref 0–40)
BUN/Creatinine Ratio: 16 (ref 9–23)
Sodium: 137 mmol/L (ref 134–144)
Total Protein: 6.8 g/dL (ref 6.0–8.5)
eGFR: 81 mL/min/{1.73_m2} (ref >59)

## 2020-08-17 NOTE — Patient Instructions (Signed)
Try to eat small frequent meals.  I am referring you to the gynecologist and they will call you to schedule a visit.  We will be in touch with your lab results.    Edema  Edema is when you have too much fluid in your body or under your skin. Edema may make your legs, feet, and ankles swell up. Swelling is also common in looser tissues, like around your eyes. This is a common condition. It gets more common as you get older. There are many possible causes of edema. Eating too much salt (sodium) and being on your feet or sitting for a long time can cause edema in your legs, feet, and ankles. Hot weather may make edema worse. Edema is usually painless. Your skin may look swollen or shiny. Follow these instructions at home:  Keep the swollen body part raised (elevated) above the level of your heart when you are sitting or lying down.  Do not sit still or stand for a long time.  Do not wear tight clothes. Do not wear garters on your upper legs.  Exercise your legs. This can help the swelling go down.  Wear elastic bandages or support stockings as told by your doctor.  Eat a low-salt (low-sodium) diet to reduce fluid as told by your doctor.  Depending on the cause of your swelling, you may need to limit how much fluid you drink (fluid restriction).  Take over-the-counter and prescription medicines only as told by your doctor. Contact a doctor if:  Treatment is not working.  You have heart, liver, or kidney disease and have symptoms of edema.  You have sudden and unexplained weight gain. Get help right away if:  You have shortness of breath or chest pain.  You cannot breathe when you lie down.  You have pain, redness, or warmth in the swollen areas.  You have heart, liver, or kidney disease and get edema all of a sudden.  You have a fever and your symptoms get worse all of a sudden. Summary  Edema is when you have too much fluid in your body or under your skin.  Edema may  make your legs, feet, and ankles swell up. Swelling is also common in looser tissues, like around your eyes.  Raise (elevate) the swollen body part above the level of your heart when you are sitting or lying down.  Follow your doctor's instructions about diet and how much fluid you can drink (fluid restriction). This information is not intended to replace advice given to you by your health care provider. Make sure you discuss any questions you have with your health care provider. Document Revised: 01/22/2020 Document Reviewed: 01/22/2020 Elsevier Patient Education  2021 [employer].

## 2020-08-17 NOTE — Progress Notes (Signed)
Subjective:    Patient ID: Carla Barton, female    DOB: February 05, 1976, 45 y.o.   MRN: [id number]  HPI Chief Complaint  Patient presents with  . new pt get established    New pt get established, swelling in feet and legs for years, severely anemia and only replenishes a couple platelets day- had multiple iron infusions, last one 2 years ago, has vitamin D definiency, LEEP surgery- almost 3 years ago,    She is new to the practice and here to establish care.  Previous medical care: Dr. Truddie Coco in [place], [place] at [facility]   Other providers:  Previous OB/GYN in [facility] in [place]    Hx of LEEP for cervical cancer approximately 3 years ago. Tubal ligation in the past  Last pap smear 2 years ago.   Anemia-states she has been anemic for years and is not on iron. Hx of needing iron infusions and last one was 2 years ago.   Microscopic hematuria history and worked up by urologist in [place]. States she was told this is normal for her.   Asthma- her whole life. No preventive needed. Uses albuterol rarely.   Reports having a long history of dependent edema.  States she has been worked up for this.  History of Covid in August 2021  Does not want vaccines.    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 Eggs, bacon, toast.  She often eats pineapple before going to bed. States she typically is not hungry and her appetite is poor. Excerise: nothing lately   Last Menstrual cycle: 08/01/2020 and regular. Bleeds for 3 days and not heavy per patient   Denies fever, chills, dizziness, chest pain, palpitations, shortness of breath, abdominal pain, N/V/D, urinary symptoms.     Reviewed allergies, medications, past medical, surgical, family, and social history.   Review of Systems Pertinent positives and negatives in the history of present illness.     Objective:    Physical Exam BP 130/84   Pulse 77   Ht 5' 7.25" (1.708 m)   Wt 298 lb 6.4 oz (135.4 kg)   LMP 08/01/2020   BMI 46.39 kg/m   Alert and in no distress. Cardiac exam shows a regular sinus rhythm without murmurs or gallops. Lungs are clear to auscultation.  Extremities-bilateral lower extremities with trace edema.  Skin is warm and dry, no pallor.       Assessment & Plan:  Mild intermittent asthma without complication -Controlled and rarely needs her inhaler.  History of cervical cancer - Plan: Ambulatory referral to Gynecology -Referral to gynecology for cervical cancer and breast cancer screening due to history of cervical cancer with LEEP procedure.  Previous OB/GYN in [place].  Morbid obesity (HCC) - Plan: Comprehensive metabolic panel, TSH, T4, free, T3 -Counseling on eating small frequent meals throughout the day and increasing her physical activity.  Check labs and follow-up.  Iron deficiency anemia, unspecified iron deficiency anemia type - Plan: CBC with Differential/Platelet, Iron, TIBC and Ferritin Panel -History of iron infusions but none in over a year.  She is not on oral iron at the moment.  She has not seen hematology in over 1 year.  Check labs and follow-up.  Poor appetite - Plan: CBC with Differential/Platelet, Comprehensive metabolic panel, TSH, T4, free, T3 -Encouraged her to try to eat small frequent meals even if it is a  protein bar or shake instead of only eating breakfast.  No unexplained weight loss check labs and follow-up  Vitamin D deficiency - Plan: VITAMIN D 25 Hydroxy (Vit-D Deficiency, Fractures) -I do not have her medical records today but she reports a history of vitamin D deficiency.  Check vitamin D level and follow-up  Vitamin B12 deficiency - Plan: Vitamin B12 -No medical records available but patient reports history of vitamin B12 deficiency.  Check B12 level and follow-up  Dependent edema - Plan: CBC with Differential/Platelet,  Comprehensive metabolic panel, TSH, T4, free, T3

## 2020-08-18 ENCOUNTER — Other Ambulatory Visit: Payer: Self-pay | Admitting: Family Medicine

## 2020-08-18 ENCOUNTER — Other Ambulatory Visit: Payer: Self-pay | Admitting: Internal Medicine

## 2020-08-18 ENCOUNTER — Telehealth: Payer: Self-pay | Admitting: Physician Assistant

## 2020-08-18 DIAGNOSIS — D75839 Thrombocytosis, unspecified: Secondary | ICD-10-CM

## 2020-08-18 DIAGNOSIS — E559 Vitamin D deficiency, unspecified: Secondary | ICD-10-CM

## 2020-08-18 DIAGNOSIS — D509 Iron deficiency anemia, unspecified: Secondary | ICD-10-CM

## 2020-08-18 LAB — COMPREHENSIVE METABOLIC PANEL
ALT: 10 IU/L (ref 0–32)
Albumin/Globulin Ratio: 1.2 (ref 1.2–2.2)
Albumin: 3.7 g/dL — ABNORMAL LOW (ref 3.8–4.8)
Alkaline Phosphatase: 86 IU/L (ref 44–121)
BUN: 15 mg/dL (ref 6–24)
Bilirubin Total: 0.4 mg/dL (ref 0.0–1.2)
CO2: 23 mmol/L (ref 20–29)
Calcium: 9.2 mg/dL (ref 8.7–10.2)
Chloride: 100 mmol/L (ref 96–106)
Creatinine, Ser: 0.91 mg/dL (ref 0.57–1.00)
Globulin, Total: 3.1 g/dL (ref 1.5–4.5)
Glucose: 83 mg/dL (ref 65–99)
Potassium: 4.9 mmol/L (ref 3.5–5.2)

## 2020-08-18 LAB — CBC WITH DIFFERENTIAL/PLATELET
Basophils Absolute: 0.1 10*3/uL (ref 0.0–0.2)
Basos: 1 %
EOS (ABSOLUTE): 0.3 10*3/uL (ref 0.0–0.4)
Eos: 3 %
Hemoglobin: 10.6 g/dL — ABNORMAL LOW (ref 11.1–15.9)
Immature Grans (Abs): 0 10*3/uL (ref 0.0–0.1)
Immature Granulocytes: 0 %
Lymphs: 27 %
MCH: 25.5 pg — ABNORMAL LOW (ref 26.6–33.0)
MCHC: 31.2 g/dL — ABNORMAL LOW (ref 31.5–35.7)
Monocytes Absolute: 0.6 10*3/uL (ref 0.1–0.9)
Monocytes: 5 %
Neutrophils: 64 %
RDW: 13.9 % (ref 11.7–15.4)
WBC: 11.2 10*3/uL — ABNORMAL HIGH (ref 3.4–10.8)

## 2020-08-18 LAB — IRON,TIBC AND FERRITIN PANEL
Ferritin: 6 ng/mL — ABNORMAL LOW (ref 15–150)
Iron Saturation: 8 % — CL (ref 15–55)
Iron: 29 ug/dL (ref 27–159)
Total Iron Binding Capacity: 377 ug/dL (ref 250–450)
UIBC: 348 ug/dL (ref 131–425)

## 2020-08-18 LAB — T4, FREE: Free T4: 0.83 ng/dL (ref 0.82–1.77)

## 2020-08-18 LAB — TSH: TSH: 2.84 u[IU]/mL (ref 0.450–4.500)

## 2020-08-18 LAB — VITAMIN B12: Vitamin B-12: 462 pg/mL (ref 232–1245)

## 2020-08-18 LAB — T3: T3, Total: 94 ng/dL (ref 71–180)

## 2020-08-18 LAB — VITAMIN D 25 HYDROXY (VIT D DEFICIENCY, FRACTURES): Vit D, 25-Hydroxy: 17.1 ng/mL — ABNORMAL LOW (ref 30.0–100.0)

## 2020-08-18 MED ORDER — VITAMIN D (ERGOCALCIFEROL) 1.25 MG (50000 UNIT) PO CAPS: 50000.0000 [IU] | ORAL_CAPSULE | ORAL | 0 refills | Status: DC

## 2020-08-18 NOTE — Telephone Encounter (Signed)
Received a new hem referral from Carla Blend, Carla Barton for thrombocytosis/anemia. Carla Barton has been cld and scheduled to see Carla Barton on 5/16 at 1:30pm w/labs at 1pm. Pt aware to arrive 20 minutes early.

## 2020-08-18 NOTE — Progress Notes (Signed)
Please put in a referral to hematology for iron deficiency anemia and thrombocytosis.

## 2020-08-23 NOTE — Progress Notes (Signed)
[facility] Telephone:([phone]) [phone]   Fax:([phone]) [phone]  CONSULT NOTE  REFERRING PHYSICIAN: Hetty Blend, NP  REASON FOR CONSULTATION:  Iron deficiency anemia and thrombocytosis  HPI Carla Barton is a 45 y.o. female with a past medical history significant for cervical cancer status post LEEP in ~2019, peritonsillar abscess, and asthma is referred to the clinic for iron deficiency anemia and thrombocytosis.  The patient recently had a follow-up visit with her primary care provider on 08/17/2020.  The patient was establishing care after moving from [place], [place]. The patient discussed that she has a history of significant iron deficiency anemia which required iron infusions in the past. She estimates that her last iron infusion was ~3 years ago. She is currently taking iron supplements with ferrous sulfate 65 mg with vitamin C BID. She takes them when she remembers which is about 3x per week. She tolerates this well without any adverse side effects.  She also saw urologist in the past for microscopic hematuria and was told that that was reportedly normal for her.  She had what sounds like a cystoscopy and imaging studies. She had routine lab work performed at her PCP including a CBC which showed a hemoglobin of 10.6, her platelet count was elevated at 472K, and her white blood cell count was slightly elevated at 11.2. Her iron studies showed a iron of 29, low saturation at 8%, ferritin of 6, and TIBC of 377. She was referred here for further evaluation and management of her condition.   Per chart review, it appears that the patient has had evidence of anemia since 2014 which is the oldest records available to me.  She has had persistent anemia since this time the lowest hemoglobin 9.4. She never required a blood transfusion. She has also had persistent thrombocytosis since this time with a platelet count between 400-500k. She reports easy gingival bleeding she reports easy  gingival bleeding which has been occurring her entire life.  She does not have a dentist.  She denies any epistaxis, hemoptysis, hematemesis, melena, or hematochezia.  As stated previously, she has microscopic hematuria which she saw urology in the past with an extensive work-up.  The patient states that her menstrual cycles are "not heavy" and last 3 days.  She reports she has persistent fatigue.  She denies any lightheadedness, decreased exercise tolerance, or shortness of breath.  Denies any particular dietary habits such as being a vegan or vegetarian; however, she does not like red meat due to patient preference. She denies any prior bariatric surgeries.  She denies any herbal supplements.  She denies any blood thinners.  She denies aspirin use.  The patient reports she has a history of vitamin B12 deficiency but her vitamin B12 was recently drawn at her PCPs which was within normal limits at 462.  The patient denies any fever, chills, night sweats, or unexplained weight loss.  She denies any lymphadenopathy.  She denies any abnormal itching after hot shower.  The patient denies eating ice chips.  The patient drinks a lot of tea.  The patient also had a colonoscopy approximately 3 years ago due to her family history of colorectal cancer which was reportedly normal.  She also reportedly had an EGD performed at this time which was also reportedly normal.  The patient's records are not available to me.  The patient's family history significant for malignancy.  The patient's mother had stomach cancer.  The patient's sister was diagnosed with colorectal cancer at  age 8.  She was most recently diagnosed with breast cancer at the age of 20.  Patient states she has several paternal aunts and uncles that have breast cancer, lung cancer, and colorectal cancer.  She denies any family history of sickle cell anemia or thalassemia.  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  She has 3 children.  She is  single.  She denies smoking, drug, or alcohol use.  HPI  Past Medical History:  Diagnosis Date  . Anemia    no current med.  . Closed fracture of middle phalanx of little finger 06/2017   right  . History of asthma    no current med.  Marland Kitchen History of cervical cancer   . Migraines     Past Surgical History:  Procedure Laterality Date  . CERVICAL BIOPSY  W/ LOOP ELECTRODE EXCISION    . CHOLECYSTECTOMY    . CLOSED REDUCTION FINGER WITH PERCUTANEOUS PINNING Right 07/06/2017   Procedure: CLOSED REDUCTION WITH PERCUTANEOUS PINNING RIGHT SMALL FINGER FRACTURE;  Surgeon: Betha Loa, MD;  Location: [facility];  Service: Orthopedics;  Laterality: Right;  . TONSILLECTOMY Bilateral 04/19/2013   Procedure: TONSILLECTOMY;  Surgeon: Christia Reading, MD;  Location[facility];  Service: ENT;  Laterality: Bilateral;  . TUBAL LIGATION      Family History  Problem Relation Age of Onset  . Hypertension Mother   . Cancer Father     Social History Social History   Tobacco Use  . Smoking status: Never Smoker  . Smokeless tobacco: Never Used  Vaping Use  . Vaping Use: Never used  Substance Use Topics  . Alcohol use: No  . Drug use: No    Allergies  Allergen Reactions  . Aspirin Other (See Comments) and Shortness Of Breath    CAUSES ASTHMA EXACERBATION  . Shellfish Allergy Shortness Of Breath and Swelling    Current Outpatient Medications  Medication Sig Dispense Refill  . albuterol (VENTOLIN HFA) 108 (90 Base) MCG/ACT inhaler Inhale 1-2 puffs into the lungs every 6 (six) hours as needed for wheezing or shortness of breath. 18 g 0  . Cholecalciferol (VITAMIN D3 PO) Take 2,000 Doses by mouth.    . ferrous sulfate 324 MG TBEC Take 324 mg by mouth.    . vitamin C (ASCORBIC ACID) 250 MG tablet Take 250 mg by mouth daily.    . Vitamin D, Ergocalciferol, (DRISDOL) 1.25 MG (50000 UNIT) CAPS capsule Take 1 capsule (50,000 Units total) by mouth every 7 (seven) days. 12 capsule 0   No  current facility-administered medications for this visit.    REVIEW OF SYSTEMS:   Review of Systems  Constitutional: Positive for fatigue.  Negative for appetite change, chills, fever and unexpected weight change.  HENT: Positive for easy gingival bleeding.  Negative for mouth sores, nosebleeds, sore throat and trouble swallowing.   Eyes: Negative for eye problems and icterus.  Respiratory: Negative for cough, hemoptysis, shortness of breath and wheezing.   Cardiovascular: Positive for bilateral lower extremity swelling.  Negative for chest pain. Gastrointestinal: Negative for abdominal pain, constipation, diarrhea, nausea and vomiting.  Genitourinary: Positive for microscopic hematuria.  Negative for bladder incontinence, difficulty urinating, dysuria, and frequency..   Musculoskeletal: Negative for back pain, gait problem, neck pain and neck stiffness.  Skin: Negative for itching and rash.  Neurological: Negative for dizziness, extremity weakness, gait problem, headaches, light-headedness and seizures.  Hematological: Negative for adenopathy. Does not bruise/bleed easily.  Psychiatric/Behavioral: Negative for confusion, depression  and sleep disturbance. The patient is not nervous/anxious.     PHYSICAL EXAMINATION:  Blood pressure (!) 135/93, pulse 92, temperature 97.9 F (36.6 C), temperature source Tympanic, resp. rate 18, height 5' 7.25" (1.708 m), weight 297 lb 4.8 oz (134.9 kg), last menstrual period 08/01/2020, SpO2 90 %.  ECOG PERFORMANCE STATUS: 1 - Symptomatic but completely ambulatory  Physical Exam  Constitutional: Oriented to person, place, and time and well-developed, well-nourished, and in no distress.  HENT:  Head: Normocephalic and atraumatic.  Mouth/Throat: Oropharynx is clear and moist. No oropharyngeal exudate.  Eyes: Conjunctivae are normal. Right eye exhibits no discharge. Left eye exhibits no discharge. No scleral icterus.  Neck: Normal range of motion. Neck  supple.  Cardiovascular: Normal rate, regular rhythm, normal heart sounds and intact distal pulses.   Pulmonary/Chest: Effort normal and breath sounds normal. No respiratory distress. No wheezes. No rales.  Abdominal: Soft. Bowel sounds are normal. Exhibits no distension and no mass. There is no tenderness.  Musculoskeletal: Normal range of motion.  Positive for bilateral lower extremity swelling. Lymphadenopathy:    No cervical adenopathy.  Neurological: Alert and oriented to person, place, and time. Exhibits normal muscle tone. Gait normal. Coordination normal.  Skin: Skin is warm and dry. No rash noted. Not diaphoretic. No erythema. No pallor.  Psychiatric: Mood, memory and judgment normal.  Vitals reviewed.  LABORATORY DATA: Lab Results  Component Value Date   WBC 11.9 (H) 08/24/2020   HGB 10.5 (L) 08/24/2020   HCT 33.9 (L) 08/24/2020   MCV 82.9 08/24/2020   PLT 496 (H) 08/24/2020      Chemistry      Component Value Date/Time   NA 138 08/24/2020 1257   NA 137 08/17/2020 1212   K 3.9 08/24/2020 1257   CL 105 08/24/2020 1257   CO2 27 08/24/2020 1257   BUN 11 08/24/2020 1257   BUN 15 08/17/2020 1212   CREATININE 0.94 08/24/2020 1257      Component Value Date/Time   CALCIUM 8.9 08/24/2020 1257   ALKPHOS 84 08/24/2020 1257   AST 16 08/24/2020 1257   ALT 7 08/24/2020 1257   BILITOT 0.8 08/24/2020 1257       RADIOGRAPHIC STUDIES: No results found.  ASSESSMENT: This is a very pleasant 45 year old African-American female referred to the clinic for iron deficiency anemia and thrombocytosis.   PLAN: The patient was seen with Dr. Arbutus Ped today.  The patient had repeat lab studies performed including an SPEP with immunofixation, repeat CBC, CMP, iron studies, ferritin, folate, and stool cards.   The patient's labs from today show a total white blood cell count slightly elevated 11.9, hemoglobin low at 10.5, normal MCV 82.9, and elevated platelet count of 496k.  Her iron  studies show iron deficiency anemia.  Her SPEP and folate are still pending at this time.    Dr. Arbutus Ped recommends that we arrange for IV iron infusions with Venofer 300 mg weekly x3.   We will see her back for follow-up visit in 8 weeks for evaluation and to repeat a CBC, iron studies, and ferritin.  For the patient's thrombocytosis, Dr. Arbutus Ped believes this is reactive and secondary to her iron deficiency anemia.  If no resolution in her platelet count with correction of her iron deficiency, then we will consider further work-up.   The patient was also instructed to take oral iron supplements.  She was instructed to take this with vitamin C as it helps with absorption.  She was also given a  handout on iron rich food.  She was advised to avoid drinking black tea as it inhibits absorption.  I will arrange for the patient to have fecal occult stool cards performed to rule out GI blood loss.  If positive, we will refer the patient to GI.  Especially given her family history of malignancy.  We will also refer the patient to genetic counseling due to her extensive family history of malignancy at young ages.  The patient may benefit from genetic testing if she is determined to be a candidate.  The patient voices understanding of current disease status and treatment options and is in agreement with the current care plan.  All questions were answered. The patient knows to call the clinic with any problems, questions or concerns. We can certainly see the patient much sooner if necessary.  Thank you so much for allowing me to participate in the care of Carla Barton. I will continue to follow up the patient with you and assist in her care.   Disclaimer: This note was dictated with voice recognition software. Similar sounding words can inadvertently be transcribed and may not be corrected upon review.   Seldon Barrell L Rett Stehlik Aug 24, 2020, 2:31 PM  ADDENDUM: Hematology/oncology  Attending: I had a face-to-face encounter with the patient today.  I reviewed her records, labs and recommended her care plan.  This is a very pleasant 45 years old African-American female with history of cervical cancer as well as asthma and history of iron deficiency anemia treated in the past in [place] with iron infusion.  She also had gastrointestinal work-up in that area a few years ago that was unremarkable.  She also has a history of microscopic hematuria evaluated by urology and was assured. She establish care with a primary care physician recently and was found on blood work to have persistent anemia with hemoglobin down to 10.6.  She also has thrombocytosis with a platelet count of 4 72,000.  Iron studies showed low serum iron of 29 with iron saturation of 8% and ferritin level of 6. The patient was referred to us today for evaluation and recommendation regarding her iron deficiency anemia and consideration of iron infusion if needed.  She did not have good results with the oral iron tablets. Repeat CBC today showed hemoglobin of 10.5 and hematocrit 33.9%.  Ferritin level was 7.  Serum iron was 37 and iron saturation 9%. Will order stool for Hemoccult and if positive for refer the patient to gastroenterology for evaluation.  She has a strong family history of malignancy.  We will refer her for genetic counseling. Will arrange for the patient to receive iron infusion with Venofer 300 Mg IV weekly for 3 weeks.  We will see her back for follow-up visit in 3 months for evaluation and repeat CBC, iron study and ferritin. The patient was advised to call immediately if she has any other concerning symptoms in the interval. The total time spent in the appointment was 60 minutes. Disclaimer: This note was dictated with voice recognition software. Similar sounding words can inadvertently be transcribed and may be missed upon review. Lajuana MatteMohamed K Mohamed, MD 08/24/20

## 2020-08-24 ENCOUNTER — Other Ambulatory Visit: Payer: Self-pay

## 2020-08-24 ENCOUNTER — Other Ambulatory Visit: Payer: Self-pay | Admitting: Physician Assistant

## 2020-08-24 ENCOUNTER — Inpatient Hospital Stay: Payer: 59

## 2020-08-24 ENCOUNTER — Inpatient Hospital Stay: Payer: 59 | Attending: Physician Assistant | Admitting: Physician Assistant

## 2020-08-24 ENCOUNTER — Encounter: Payer: Self-pay | Admitting: Physician Assistant

## 2020-08-24 VITALS — BP 135/93 | HR 92 | Temp 97.9°F | Resp 18 | Ht 67.25 in | Wt 297.3 lb

## 2020-08-24 DIAGNOSIS — Z8541 Personal history of malignant neoplasm of cervix uteri: Secondary | ICD-10-CM | POA: Diagnosis not present

## 2020-08-24 DIAGNOSIS — Z803 Family history of malignant neoplasm of breast: Secondary | ICD-10-CM

## 2020-08-24 DIAGNOSIS — D75839 Thrombocytosis, unspecified: Secondary | ICD-10-CM

## 2020-08-24 DIAGNOSIS — D509 Iron deficiency anemia, unspecified: Secondary | ICD-10-CM

## 2020-08-24 DIAGNOSIS — Z809 Family history of malignant neoplasm, unspecified: Secondary | ICD-10-CM

## 2020-08-24 DIAGNOSIS — Z800 Family history of malignant neoplasm of digestive organs: Secondary | ICD-10-CM

## 2020-08-24 LAB — CMP (CANCER CENTER ONLY)
ALT: 7 U/L (ref 0–44)
AST: 16 U/L (ref 15–41)
Albumin: 3.4 g/dL — ABNORMAL LOW (ref 3.5–5.0)
Alkaline Phosphatase: 84 U/L (ref 38–126)
Anion gap: 6 (ref 5–15)
BUN: 11 mg/dL (ref 6–20)
CO2: 27 mmol/L (ref 22–32)
Calcium: 8.9 mg/dL (ref 8.9–10.3)
Chloride: 105 mmol/L (ref 98–111)
Creatinine: 0.94 mg/dL (ref 0.44–1.00)
GFR, Estimated: >60 mL/min (ref >60)
Glucose, Bld: 86 mg/dL (ref 70–99)
Potassium: 3.9 mmol/L (ref 3.5–5.1)
Sodium: 138 mmol/L (ref 135–145)
Total Bilirubin: 0.8 mg/dL (ref 0.3–1.2)
Total Protein: 7.6 g/dL (ref 6.5–8.1)

## 2020-08-24 LAB — CBC WITH DIFFERENTIAL (CANCER CENTER ONLY)
Abs Immature Granulocytes: 0.04 10*3/uL (ref 0.00–0.07)
Basophils Absolute: 0.1 10*3/uL (ref 0.0–0.1)
Basophils Relative: 1 %
Eosinophils Absolute: 0.2 10*3/uL (ref 0.0–0.5)
Eosinophils Relative: 2 %
HCT: 33.9 % — ABNORMAL LOW (ref 36.0–46.0)
Hemoglobin: 10.5 g/dL — ABNORMAL LOW (ref 12.0–15.0)
Immature Granulocytes: 0 %
Lymphocytes Relative: 23 %
Lymphs Abs: 2.8 10*3/uL (ref 0.7–4.0)
MCH: 25.7 pg — ABNORMAL LOW (ref 26.0–34.0)
MCHC: 31 g/dL (ref 30.0–36.0)
MCV: 82.9 fL (ref 80.0–100.0)
Monocytes Absolute: 0.7 10*3/uL (ref 0.1–1.0)
Monocytes Relative: 6 %
Neutro Abs: 8.1 10*3/uL — ABNORMAL HIGH (ref 1.7–7.7)
Neutrophils Relative %: 68 %
Platelet Count: 496 10*3/uL — ABNORMAL HIGH (ref 150–400)
RBC: 4.09 MIL/uL (ref 3.87–5.11)
RDW: 14.8 % (ref 11.5–15.5)
WBC Count: 11.9 10*3/uL — ABNORMAL HIGH (ref 4.0–10.5)
nRBC: 0 % (ref 0.0–0.2)

## 2020-08-24 LAB — FERRITIN: Ferritin: 7 ng/mL — ABNORMAL LOW (ref 11–307)

## 2020-08-24 LAB — IRON AND TIBC
Iron: 37 ug/dL — ABNORMAL LOW (ref 41–142)
Saturation Ratios: 9 % — ABNORMAL LOW (ref 21–57)
TIBC: 410 ug/dL (ref 236–444)
UIBC: 374 ug/dL (ref 120–384)

## 2020-08-24 LAB — FOLATE: Folate: 10 ng/mL (ref >5.9)

## 2020-08-24 NOTE — Patient Instructions (Signed)

## 2020-08-25 ENCOUNTER — Telehealth: Payer: Self-pay | Admitting: Genetic Counselor

## 2020-08-25 NOTE — Telephone Encounter (Signed)
Scheduled appt per 5/16 sch msg. Pt aware.  

## 2020-08-26 LAB — PROTEIN ELECTROPHORESIS, SERUM, WITH REFLEX
A/G Ratio: 0.9 (ref 0.7–1.7)
Albumin ELP: 3.4 g/dL (ref 2.9–4.4)
Alpha-1-Globulin: 0.2 g/dL (ref 0.0–0.4)
Alpha-2-Globulin: 0.8 g/dL (ref 0.4–1.0)
Beta Globulin: 1 g/dL (ref 0.7–1.3)
Gamma Globulin: 1.6 g/dL (ref 0.4–1.8)
Globulin, Total: 3.6 g/dL (ref 2.2–3.9)
Total Protein ELP: 7 g/dL (ref 6.0–8.5)

## 2020-08-28 ENCOUNTER — Telehealth: Payer: Self-pay | Admitting: Family Medicine

## 2020-08-28 NOTE — Telephone Encounter (Signed)
Requested records received from [facility]

## 2020-09-01 ENCOUNTER — Encounter: Payer: Self-pay | Admitting: Internal Medicine

## 2020-09-03 ENCOUNTER — Telehealth: Payer: Self-pay

## 2020-09-03 ENCOUNTER — Other Ambulatory Visit: Payer: Self-pay

## 2020-09-03 ENCOUNTER — Inpatient Hospital Stay: Payer: 59

## 2020-09-03 VITALS — BP 142/81 | HR 81 | Temp 98.3°F | Resp 18

## 2020-09-03 DIAGNOSIS — D509 Iron deficiency anemia, unspecified: Secondary | ICD-10-CM

## 2020-09-03 MED ORDER — SODIUM CHLORIDE 0.9 % IV SOLN
300.0000 mg | Freq: Once | INTRAVENOUS | Status: AC
  Administered 2020-09-03: 300 mg via INTRAVENOUS
  Filled 2020-09-03: qty 10

## 2020-09-03 MED ORDER — ACETAMINOPHEN 325 MG PO TABS: 650.0000 mg | ORAL_TABLET | Freq: Once | ORAL | Status: DC

## 2020-09-03 MED ORDER — SODIUM CHLORIDE 0.9 % IV SOLN
Freq: Once | INTRAVENOUS | Status: AC
  Filled 2020-09-03: qty 250

## 2020-09-03 MED ORDER — DIPHENHYDRAMINE HCL 25 MG PO CAPS: 50.0000 mg | ORAL_CAPSULE | Freq: Once | ORAL | Status: DC

## 2020-09-03 NOTE — Patient Instructions (Signed)
Iron Sucrose injection (Venofer) What is this medicine? IRON SUCROSE (AHY ern SOO krohs) is an iron complex. Iron is used to make healthy red blood cells, which carry oxygen and nutrients throughout the body. This medicine is used to treat iron deficiency anemia in people with chronic kidney disease. This medicine may be used for other purposes; ask your health care provider or pharmacist if you have questions. COMMON BRAND NAME(S): Venofer What should I tell my health care provider before I take this medicine? They need to know if you have any of these conditions:  anemia not caused by low iron levels  heart disease  high levels of iron in the blood  kidney disease  liver disease  an unusual or allergic reaction to iron, other medicines, foods, dyes, or preservatives  pregnant or trying to get pregnant  breast-feeding How should I use this medicine? This medicine is for infusion into a vein. It is given by a health care professional in a hospital or clinic setting. Talk to your pediatrician regarding the use of this medicine in children. While this drug may be prescribed for children as young as 2 years for selected conditions, precautions do apply. Overdosage: If you think you have taken too much of this medicine contact a poison control center or emergency room at once. NOTE: This medicine is only for you. Do not share this medicine with others. What if I miss a dose? It is important not to miss your dose. Call your doctor or health care professional if you are unable to keep an appointment. What may interact with this medicine? Do not take this medicine with any of the following medications:  deferoxamine  dimercaprol  other iron products This medicine may also interact with the following medications:  chloramphenicol  deferasirox This list may not describe all possible interactions. Give your health care provider a list of all the medicines, herbs, non-prescription  drugs, or dietary supplements you use. Also tell them if you smoke, drink alcohol, or use illegal drugs. Some items may interact with your medicine. What should I watch for while using this medicine? Visit your doctor or healthcare professional regularly. Tell your doctor or healthcare professional if your symptoms do not start to get better or if they get worse. You may need blood work done while you are taking this medicine. You may need to follow a special diet. Talk to your doctor. Foods that contain iron include: whole grains/cereals, dried fruits, beans, or peas, leafy green vegetables, and organ meats (liver, kidney). What side effects may I notice from receiving this medicine? Side effects that you should report to your doctor or health care professional as soon as possible:  allergic reactions like skin rash, itching or hives, swelling of the face, lips, or tongue  breathing problems  changes in blood pressure  cough  fast, irregular heartbeat  feeling faint or lightheaded, falls  fever or chills  flushing, sweating, or hot feelings  joint or muscle aches/pains  seizures  swelling of the ankles or feet  unusually weak or tired Side effects that usually do not require medical attention (report to your doctor or health care professional if they continue or are bothersome):  diarrhea  feeling achy  headache  irritation at site where injected  nausea, vomiting  stomach upset  tiredness This list may not describe all possible side effects. Call your doctor for medical advice about side effects. You may report side effects to FDA at 1-800-FDA-1088. Where should I   keep my medicine? This drug is given in a hospital or clinic and will not be stored at home. NOTE: This sheet is a summary. It may not cover all possible information. If you have questions about this medicine, talk to your doctor, pharmacist, or health care provider.  2021 Elsevier/Gold Standard  (2011-01-06 17:14:35)  

## 2020-09-03 NOTE — Telephone Encounter (Signed)
appts moved to mondays per secure chat, also move 6/2 week to week 6/13    Carla Barton

## 2020-09-10 ENCOUNTER — Inpatient Hospital Stay: Payer: 59

## 2020-09-14 ENCOUNTER — Other Ambulatory Visit: Payer: Self-pay

## 2020-09-14 ENCOUNTER — Other Ambulatory Visit: Payer: 59

## 2020-09-14 ENCOUNTER — Other Ambulatory Visit: Payer: Self-pay | Admitting: Genetic Counselor

## 2020-09-14 ENCOUNTER — Encounter: Payer: 59 | Admitting: Genetic Counselor

## 2020-09-14 ENCOUNTER — Inpatient Hospital Stay: Payer: 59 | Attending: Physician Assistant

## 2020-09-14 VITALS — BP 140/88 | HR 83 | Temp 98.2°F | Resp 18

## 2020-09-14 DIAGNOSIS — Z800 Family history of malignant neoplasm of digestive organs: Secondary | ICD-10-CM

## 2020-09-14 DIAGNOSIS — D509 Iron deficiency anemia, unspecified: Secondary | ICD-10-CM | POA: Diagnosis not present

## 2020-09-14 DIAGNOSIS — Z803 Family history of malignant neoplasm of breast: Secondary | ICD-10-CM

## 2020-09-14 MED ORDER — IRON SUCROSE 20 MG/ML IV SOLN
300.0000 mg | Freq: Once | INTRAVENOUS | Status: AC
  Administered 2020-09-14: 300 mg via INTRAVENOUS
  Filled 2020-09-14: qty 10

## 2020-09-14 MED ORDER — SODIUM CHLORIDE 0.9 % IV SOLN
Freq: Once | INTRAVENOUS | Status: AC
  Filled 2020-09-14: qty 250

## 2020-09-14 NOTE — Patient Instructions (Signed)

## 2020-09-14 NOTE — Addendum Note (Signed)
Addended by: Christiane Ha on: 09/14/2020 02:46 PM   Modules accepted: Orders

## 2020-09-17 ENCOUNTER — Inpatient Hospital Stay: Payer: 59

## 2020-09-21 ENCOUNTER — Other Ambulatory Visit: Payer: Self-pay

## 2020-09-21 ENCOUNTER — Ambulatory Visit: Payer: 59 | Admitting: Obstetrics & Gynecology

## 2020-09-21 ENCOUNTER — Inpatient Hospital Stay: Payer: 59

## 2020-09-21 VITALS — BP 140/90 | HR 76 | Temp 98.3°F | Resp 18

## 2020-09-21 DIAGNOSIS — D509 Iron deficiency anemia, unspecified: Secondary | ICD-10-CM | POA: Diagnosis not present

## 2020-09-21 MED ORDER — SODIUM CHLORIDE 0.9 % IV SOLN
Freq: Once | INTRAVENOUS | Status: AC
  Filled 2020-09-21: qty 250

## 2020-09-21 MED ORDER — SODIUM CHLORIDE 0.9 % IV SOLN
300.0000 mg | Freq: Once | INTRAVENOUS | Status: AC
  Administered 2020-09-21: 300 mg via INTRAVENOUS
  Filled 2020-09-21: qty 10

## 2020-09-21 NOTE — Patient Instructions (Signed)

## 2020-09-24 ENCOUNTER — Ambulatory Visit: Payer: 59

## 2020-09-25 ENCOUNTER — Other Ambulatory Visit (HOSPITAL_COMMUNITY)
Admission: RE | Admit: 2020-09-25 | Discharge: 2020-09-25 | Disposition: A | Discharge status: Home / Self Care | Payer: 59 | Source: Ambulatory Visit | Attending: Obstetrics & Gynecology | Admitting: Obstetrics & Gynecology

## 2020-09-25 ENCOUNTER — Other Ambulatory Visit: Payer: Self-pay

## 2020-09-25 ENCOUNTER — Encounter: Payer: Self-pay | Admitting: Obstetrics & Gynecology

## 2020-09-25 ENCOUNTER — Ambulatory Visit (INDEPENDENT_AMBULATORY_CARE_PROVIDER_SITE_OTHER): Payer: 59 | Admitting: Obstetrics & Gynecology

## 2020-09-25 VITALS — BP 134/86 | Ht 67.0 in | Wt 299.0 lb

## 2020-09-25 DIAGNOSIS — D509 Iron deficiency anemia, unspecified: Secondary | ICD-10-CM | POA: Diagnosis not present

## 2020-09-25 DIAGNOSIS — Z01419 Encounter for gynecological examination (general) (routine) without abnormal findings: Secondary | ICD-10-CM | POA: Diagnosis present

## 2020-09-25 DIAGNOSIS — Z8541 Personal history of malignant neoplasm of cervix uteri: Secondary | ICD-10-CM | POA: Insufficient documentation

## 2020-09-25 MED ORDER — JUNEL FE 24 1-20 MG-MCG(24) PO TABS: 1.0000 | ORAL_TABLET | Freq: Every day | ORAL | 4 refills | Status: DC

## 2020-09-25 NOTE — Progress Notes (Signed)
Carla Barton October 12, 1975 [id number]   History:    45 y.o. G3P3L3 Boyfriend.  Son 22 yo [employer], son 44 yo working, daughter 49 yo rising senior HS.  RP:  New patient presenting for annual gyn exam   HPI: Menses regular normal.  No BTB.  No pelvic pain.  Breasts normal.  H/O LEEP 3 years ago for early Cervical Ca.  Will obtain pathology.  Last Pap test normal in 2020.  Breasts normal.  BMI 46.83.  Followed by [facility], receiving IV Iron transfusions.  Last Hb 10.5 on 08/24/2020.  Past medical history,surgical history, family history and social history were all reviewed and documented in the EPIC chart.  Gynecologic History Patient's last menstrual period was 09/22/2020.  Obstetric History OB History  Gravida Para Term Preterm AB Living  3 3 3     3   SAB IAB Ectopic Multiple Live Births               # Outcome Date GA Lbr Len/2nd Weight Sex Delivery Anes PTL Lv  3 Term           2 Term           1 Term              ROS: A ROS was performed and pertinent positives and negatives are included in the history.  GENERAL: No fevers or chills. HEENT: No change in vision, no earache, sore throat or sinus congestion. NECK: No pain or stiffness. CARDIOVASCULAR: No chest pain or pressure. No palpitations. PULMONARY: No shortness of breath, cough or wheeze. GASTROINTESTINAL: No abdominal pain, nausea, vomiting or diarrhea, melena or bright red blood per rectum. GENITOURINARY: No urinary frequency, urgency, hesitancy or dysuria. MUSCULOSKELETAL: No joint or muscle pain, no back pain, no recent trauma. DERMATOLOGIC: No rash, no itching, no lesions. ENDOCRINE: No polyuria, polydipsia, no heat or cold intolerance. No recent change in weight. HEMATOLOGICAL: No anemia or easy bruising or bleeding. NEUROLOGIC: No headache, seizures, numbness, tingling or weakness. PSYCHIATRIC: No depression, no loss of interest in normal activity or change in sleep pattern.     Exam:   BP 134/86 (Cuff Size: Large)    Ht 5\' 7"  (1.702 m)   Wt 299 lb (135.6 kg)   LMP 09/22/2020   BMI 46.83 kg/m   Body mass index is 46.83 kg/m.  General appearance : Well developed well nourished female. No acute distress HEENT: Eyes: no retinal hemorrhage or exudates,  Neck supple, trachea midline, no carotid bruits, no thyroidmegaly Lungs: Clear to auscultation, no rhonchi or wheezes, or rib retractions  Heart: Regular rate and rhythm, no murmurs or gallops Breast:Examined in sitting and supine position were symmetrical in appearance, no palpable masses or tenderness,  no skin retraction, no nipple inversion, no nipple discharge, no skin discoloration, no axillary or supraclavicular lymphadenopathy Abdomen: no palpable masses or tenderness, no rebound or guarding Extremities: no edema or skin discoloration or tenderness  Pelvic: Vulva: Normal             Vagina: No gross lesions or discharge  Cervix: No gross lesions or discharge.  Pap/HPV HR done.  Uterus  AV, normal size, shape and consistency, non-tender and mobile  Adnexa  Without masses or tenderness  Anus: Normal   Assessment/Plan:  45 y.o. female for annual exam   1. Encounter for routine gynecological examination with Papanicolaou smear of cervix Normal gynecologic exam.  Pap test with high-risk HPV done today.  Breast exam normal.  Schedule screening mammogram now.  Health labs through family physician and hematologist. - Cytology - PAP( [facility])  2. Iron deficiency anemia, unspecified iron deficiency anemia type Iron deficiency anemia on IV iron.  Last hemoglobin in Aug 24, 2020 was improved at 10.5.  Will rule out secondary anemia due to menorrhagia.  Follow-up pelvic ultrasound to evaluate the uterus and endometrium.  Decision to start on Junel FE 24 1/20 continuously to avoid menses.  No contraindication to birth control pills.  Usage, risks and benefits reviewed.  Prescription sent to pharmacy. - US Transvaginal Non-OB; Future  3. History of  cervical cancer History of early cervical cancer 3 years ago.  Patient had a LEEP procedure with negative margins per patient.  No additional surgery or treatment recommended per patient.  Last Pap test in 2020 was negative.  Pap test repeated today with high risk HPV.  Will obtain the pathology on the LEEP. - Cytology - PAP( [facility])  Other orders - Norethindrone Acetate-Ethinyl Estrad-FE (JUNEL FE 24) 1-20 MG-MCG(24) tablet; Take 1 tablet by mouth daily. Continuous use to avoid menses re Iron deficiency anemia.   Genia Del MD, 12:25 PM 09/25/2020

## 2020-09-28 LAB — CYTOLOGY - PAP
Comment: NEGATIVE
Diagnosis: NEGATIVE
High risk HPV: NEGATIVE

## 2020-10-22 ENCOUNTER — Other Ambulatory Visit: Payer: 59 | Admitting: Obstetrics & Gynecology

## 2020-10-22 ENCOUNTER — Other Ambulatory Visit: Payer: 59

## 2020-10-26 ENCOUNTER — Ambulatory Visit: Payer: 59 | Admitting: Internal Medicine

## 2020-10-26 ENCOUNTER — Other Ambulatory Visit: Payer: 59

## 2020-10-26 ENCOUNTER — Telehealth: Payer: Self-pay | Admitting: Medical Oncology

## 2020-10-26 ENCOUNTER — Telehealth: Payer: Self-pay | Admitting: Physician Assistant

## 2020-10-26 NOTE — Telephone Encounter (Signed)
Pt did not show up for appts today . I called her and she woke up sick with sinus pressure and a cough. Schedule message sent.

## 2020-10-26 NOTE — Telephone Encounter (Signed)
Scheduled appt per 7/18 sch msg. Pt aware.  

## 2020-10-28 NOTE — Progress Notes (Deleted)
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 Last dose on 09/21/20  CURRENT THERAPY: Oral iron supplements p.o. twice daily.  INTERVAL HISTORY: Carla Barton 45 y.o. female returns to the clinic today for a follow-up visit.  The patient was first seen in the clinic on 08/24/2020 for iron deficiency anemia and thrombocytosis.  It is believed that her thrombocytosis is likely reactive and secondary to her iron deficiency anemia.  The patient states that she takes iron supplements  _and is compliant with this.  However, she does report at her last appointment that she does drink a lot of tea which inhibits absorption of iron.  Since that time, she has been ensuring not to drink as much tea.  She also met with the genetic counselors in the interval due to her extensive family history of malignancy.  It appears she no showed her appointment.  She was supposed to performed stool cards to rule out GI blood loss which she did not return to the clinic.  The patient also had a colonoscopy approximately 3 years ago due to her family history of colorectal cancer which was reportedly normal.  She also reportedly had an EGD performed at this time which was also reportedly normal.  The patient's records are not available to me. She also recently followed up with her OB/GYN.  The patient does deny heavy menstrual bleeding.  She is also on birth control pills.  Her OB/GYN is planning on performing a transvaginal ultrasound to rule out any causes of heavy menstrual bleeding and anemia.  Of note, the patient does endorse heavy gingival bleeding as well as microscopic hematuria for which she has seen urology in the past for this.   The patient received weekly Venofer infusions her most recent being  on 09/21/2020.  She tolerated this _reports she has persistent fatigue.  She denies any lightheadedness, decreased exercise tolerance, or shortness of breath.  Denies any particular dietary habits such as being a vegan or vegetarian; however, she does not like red meat due to patient preference. She denies any prior bariatric surgeries.  She denies any herbal supplements.  She denies any blood thinners.  She denies aspirin use. The patient denies any fever, chills, night sweats, or unexplained weight loss.  She denies any lymphadenopathy.  She denies any abnormal itching after hot shower.  The patient denies eating ice chips.  She is here today for evaluation and repeat blood work.     MEDICAL HISTORY: Past Medical History:  Diagnosis Date   Anemia    no current med.   Closed fracture of middle phalanx of little finger 06/2017   right   History of asthma    no current med.   History of cervical cancer    Migraines     ALLERGIES:  is allergic to aspirin and shellfish allergy.  MEDICATIONS:  Current Outpatient Medications  Medication Sig Dispense Refill   albuterol (VENTOLIN HFA) 108 (90 Base) MCG/ACT inhaler Inhale 1-2 puffs into the lungs every 6 (six) hours as needed for wheezing or shortness of breath. 18 g 0   Cholecalciferol (VITAMIN D3 PO) Take 2,000 Doses by mouth.     ferrous sulfate 324 MG TBEC Take 324 mg by mouth.     Norethindrone Acetate-Ethinyl Estrad-FE (JUNEL FE  24) 1-20 MG-MCG(24) tablet Take 1 tablet by mouth daily. Continuous use to avoid menses re Iron deficiency anemia. 84 tablet 4   No current facility-administered medications for this visit.    SURGICAL HISTORY:  Past Surgical History:  Procedure Laterality Date   CERVICAL BIOPSY  W/ LOOP ELECTRODE EXCISION     CHOLECYSTECTOMY     CLOSED REDUCTION FINGER WITH PERCUTANEOUS PINNING Right 07/06/2017   Procedure: CLOSED REDUCTION WITH PERCUTANEOUS PINNING RIGHT SMALL FINGER FRACTURE;  Surgeon: Leanora Cover, MD;   Location: [facility];  Service: Orthopedics;  Laterality: Right;   TONSILLECTOMY Bilateral 04/19/2013   Procedure: TONSILLECTOMY;  Surgeon: Melida Quitter, MD;  Location: [facility];  Service: ENT;  Laterality: Bilateral;   TUBAL LIGATION      REVIEW OF SYSTEMS:   Review of Systems  Constitutional: Negative for appetite change, chills, fatigue, fever and unexpected weight change.  HENT:   Negative for mouth sores, nosebleeds, sore throat and trouble swallowing.   Eyes: Negative for eye problems and icterus.  Respiratory: Negative for cough, hemoptysis, shortness of breath and wheezing.   Cardiovascular: Negative for chest pain and leg swelling.  Gastrointestinal: Negative for abdominal pain, constipation, diarrhea, nausea and vomiting.  Genitourinary: Negative for bladder incontinence, difficulty urinating, dysuria, frequency and hematuria.   Musculoskeletal: Negative for back pain, gait problem, neck pain and neck stiffness.  Skin: Negative for itching and rash.  Neurological: Negative for dizziness, extremity weakness, gait problem, headaches, light-headedness and seizures.  Hematological: Negative for adenopathy. Does not bruise/bleed easily.  Psychiatric/Behavioral: Negative for confusion, depression and sleep disturbance. The patient is not nervous/anxious.     PHYSICAL EXAMINATION:  There were no vitals taken for this visit.  ECOG PERFORMANCE STATUS: {CHL ONC ECOG [id number]  Physical Exam  Constitutional: Oriented to person, place, and time and well-developed, well-nourished, and in no distress. No distress.  HENT:  Head: Normocephalic and atraumatic.  Mouth/Throat: Oropharynx is clear and moist. No oropharyngeal exudate.  Eyes: Conjunctivae are normal. Right eye exhibits no discharge. Left eye exhibits no discharge. No scleral icterus.  Neck: Normal range of motion. Neck supple.  Cardiovascular: Normal rate, regular rhythm, normal heart sounds and intact distal  pulses.   Pulmonary/Chest: Effort normal and breath sounds normal. No respiratory distress. No wheezes. No rales.  Abdominal: Soft. Bowel sounds are normal. Exhibits no distension and no mass. There is no tenderness.  Musculoskeletal: Normal range of motion. Exhibits no edema.  Lymphadenopathy:    No cervical adenopathy.  Neurological: Alert and oriented to person, place, and time. Exhibits normal muscle tone. Gait normal. Coordination normal.  Skin: Skin is warm and dry. No rash noted. Not diaphoretic. No erythema. No pallor.  Psychiatric: Mood, memory and judgment normal.  Vitals reviewed.  LABORATORY DATA: Lab Results  Component Value Date   WBC 11.9 (H) 08/24/2020   HGB 10.5 (L) 08/24/2020   HCT 33.9 (L) 08/24/2020   MCV 82.9 08/24/2020   PLT 496 (H) 08/24/2020      Chemistry      Component Value Date/Time   NA 138 08/24/2020 1257   NA 137 08/17/2020 1212   K 3.9 08/24/2020 1257   CL 105 08/24/2020 1257   CO2 27 08/24/2020 1257   BUN 11 08/24/2020 1257   BUN 15 08/17/2020 1212   CREATININE 0.94 08/24/2020 1257      Component Value Date/Time   CALCIUM 8.9 08/24/2020 1257   ALKPHOS 84 08/24/2020 1257   AST 16 08/24/2020 1257  ALT 7 08/24/2020 1257   BILITOT 0.8 08/24/2020 1257       RADIOGRAPHIC STUDIES:  No results found.   ASSESSMENT/PLAN:  This is a very pleasant 45 year old African-American female referred to the clinic for iron deficiency anemia and thrombocytosis.  The patient was seen with Dr. Julien Nordmann today.  The patient had repeat lab studies performed including an SPEP with immunofixation, repeat CBC, CMP, iron studies, ferritin, folate, and stool cards.   The patient's labs from today show a total white blood cell count slightly elevated _, hemoglobin low at _, normal MCV _, and elevated platelet count of _k.  Her iron studies show _  Recommend that she _.  She was instructed to continue taking her iron supplement.  I strongly encouraged the  patient to reschedule her appointment with genetic counseling given her strong family history of malignancy at a young age.  I also strongly encouraged the patient to return her stool cards to the clinic to rule out GI blood loss.  The patient was advised to call immediately if she has any concerning symptoms in the interval. The patient voices understanding of current disease status and treatment options and is in agreement with the current care plan. All questions were answered. The patient knows to call the clinic with any problems, questions or concerns. We can certainly see the patient much sooner if necessary             No orders of the defined types were placed in this encounter.    I spent {CHL ONC TIME VISIT - ZSMOL:[id number]} counseling the patient face to face. The total time spent in the appointment was {CHL ONC TIME VISIT - EFEOF:[id number]}.  Faylinn Schwenn L Nikia Mangino, PA-C 10/28/20

## 2020-11-02 ENCOUNTER — Other Ambulatory Visit: Payer: 59

## 2020-11-02 ENCOUNTER — Inpatient Hospital Stay: Payer: 59 | Attending: Physician Assistant

## 2020-11-02 ENCOUNTER — Other Ambulatory Visit: Payer: Self-pay

## 2020-11-02 ENCOUNTER — Ambulatory Visit: Payer: 59 | Admitting: Physician Assistant

## 2020-11-02 ENCOUNTER — Inpatient Hospital Stay (HOSPITAL_BASED_OUTPATIENT_CLINIC_OR_DEPARTMENT_OTHER): Payer: 59 | Admitting: Physician Assistant

## 2020-11-02 VITALS — BP 145/113 | HR 98 | Temp 97.8°F | Resp 18 | Ht 67.0 in | Wt 291.8 lb

## 2020-11-02 DIAGNOSIS — D509 Iron deficiency anemia, unspecified: Secondary | ICD-10-CM | POA: Diagnosis present

## 2020-11-02 DIAGNOSIS — D75839 Thrombocytosis, unspecified: Secondary | ICD-10-CM | POA: Insufficient documentation

## 2020-11-02 DIAGNOSIS — Z809 Family history of malignant neoplasm, unspecified: Secondary | ICD-10-CM

## 2020-11-02 LAB — CBC WITH DIFFERENTIAL (CANCER CENTER ONLY)
Abs Immature Granulocytes: 0.04 10*3/uL (ref 0.00–0.07)
Basophils Absolute: 0.1 10*3/uL (ref 0.0–0.1)
Basophils Relative: 1 %
Eosinophils Absolute: 0.2 10*3/uL (ref 0.0–0.5)
Eosinophils Relative: 2 %
HCT: 37.8 % (ref 36.0–46.0)
Hemoglobin: 12.4 g/dL (ref 12.0–15.0)
Immature Granulocytes: 0 %
Lymphocytes Relative: 24 %
Lymphs Abs: 2.7 10*3/uL (ref 0.7–4.0)
MCH: 28.5 pg (ref 26.0–34.0)
MCHC: 32.8 g/dL (ref 30.0–36.0)
MCV: 86.9 fL (ref 80.0–100.0)
Monocytes Absolute: 0.5 10*3/uL (ref 0.1–1.0)
Monocytes Relative: 4 %
Neutro Abs: 7.9 10*3/uL — ABNORMAL HIGH (ref 1.7–7.7)
Neutrophils Relative %: 69 %
Platelet Count: 449 10*3/uL — ABNORMAL HIGH (ref 150–400)
RBC: 4.35 MIL/uL (ref 3.87–5.11)
RDW: 16.4 % — ABNORMAL HIGH (ref 11.5–15.5)
WBC Count: 11.4 10*3/uL — ABNORMAL HIGH (ref 4.0–10.5)
nRBC: 0 % (ref 0.0–0.2)

## 2020-11-02 LAB — IRON AND TIBC
Iron: 71 ug/dL (ref 41–142)
Saturation Ratios: 24 % (ref 21–57)
TIBC: 291 ug/dL (ref 236–444)
UIBC: 221 ug/dL (ref 120–384)

## 2020-11-02 LAB — FERRITIN: Ferritin: 88 ng/mL (ref 11–307)

## 2020-11-02 NOTE — Progress Notes (Addendum)
[facility] OFFICE PROGRESS NOTE  Avanell Shackleton, NP-C [address] [place] [address]  DIAGNOSIS: Iron deficiency anemia and thrombocytosis  PRIOR THERAPY: None  CURRENT THERAPY: 1) PRN IV iron with venofer 300 mg. Last dose on 09/21/20. 2) Oral iron supplements BID p.o. daily   INTERVAL HISTORY: Carla Barton 45 y.o. female returns to the clinic today for a follow-up visit.  The patient was last seen in the clinic on 08/24/2020.  At that time she was referred to the clinic to establish care locally for her history of iron deficiency anemia which previously required IV iron infusions.  At her last appointment, the patient's labs showed iron deficiency in which we arrange for weekly IV iron with Venofer 300 mg IV x3.  She tolerated this well without any concerning adverse side effects.  She also takes oral iron supplements daily and is compliant with this.  She is also been juicing with celery, kale, pineapple, and ginger daily.  She also has been avoiding taking her iron supplement with tea.   Overall, the patient notes some improvement in her fatigue.  She denies any chest pain, shortness of breath, lightheadedness, or any abnormal bleeding or bruising.  In the interval, the patient had a follow-up appointment with her GYN.  The patient does not have heavy menstrual periods but states that given her anemia her GYN wants to help prevent worsening of her anemia/iron deficiency and is planning on performing a transvaginal ultrasound.  They also placed her on hormone/birth control to help eliminate her periods.  At her last appointment, given her extensive family history for malignancy, particularly at a young age, the patient was referred to genetic counseling.  She was unable to make these appointments because they conflicted with other appointments.  She would be interesting in rescheduling.  Additionally, the patient never completed her stool cards which were given to  her at her last appointment to rule out GI blood loss.  The patient did have a colonoscopy approximately 3 years ago due to her family history of colorectal cancer which was reportedly normal.  This was [place] out of state in [place].  She also reportedly had an EGD at this time which is also reportedly normal.  Overall, the patient denies any concerning bleeding or bruising.  She is here today for evaluation and repeat blood work.       MEDICAL HISTORY: Past Medical History:  Diagnosis Date   Anemia    no current med.   Closed fracture of middle phalanx of little finger 06/2017   right   History of asthma    no current med.   History of cervical cancer    Migraines     ALLERGIES:  is allergic to aspirin and shellfish allergy.  MEDICATIONS:  Current Outpatient Medications  Medication Sig Dispense Refill   albuterol (VENTOLIN HFA) 108 (90 Base) MCG/ACT inhaler Inhale 1-2 puffs into the lungs every 6 (six) hours as needed for wheezing or shortness of breath. 18 g 0   Cholecalciferol (VITAMIN D3 PO) Take 2,000 Doses by mouth.     ferrous sulfate 324 MG TBEC Take 324 mg by mouth.     Norethindrone Acetate-Ethinyl Estrad-FE (JUNEL FE 24) 1-20 MG-MCG(24) tablet Take 1 tablet by mouth daily. Continuous use to avoid menses re Iron deficiency anemia. 84 tablet 4   No current facility-administered medications for this visit.    SURGICAL HISTORY:  Past Surgical History:  Procedure Laterality Date  CERVICAL BIOPSY  W/ LOOP ELECTRODE EXCISION     CHOLECYSTECTOMY     CLOSED REDUCTION FINGER WITH PERCUTANEOUS PINNING Right 07/06/2017   Procedure: CLOSED REDUCTION WITH PERCUTANEOUS PINNING RIGHT SMALL FINGER FRACTURE;  Surgeon: Betha Loa, MD;  Location: [facility];  Service: Orthopedics;  Laterality: Right;   TONSILLECTOMY Bilateral 04/19/2013   Procedure: TONSILLECTOMY;  Surgeon: Christia Reading, MD;  Location: [facility];  Service: ENT;  Laterality: Bilateral;   TUBAL  LIGATION      REVIEW OF SYSTEMS:   Review of Systems Constitutional:   Negative for appetite change, fatigue, chills, fever and unexpected weight change. HENT: Positive for easy gingival bleeding.  Negative for mouth sores, nosebleeds, sore throat and trouble swallowing.   Eyes: Negative for eye problems and icterus. Respiratory: Negative for cough, hemoptysis, shortness of breath and wheezing.   Cardiovascular:  Negative for chest pain and extremity swelling. Gastrointestinal: Negative for abdominal pain, constipation, diarrhea, nausea and vomiting. Genitourinary: Positive for microscopic hematuria.  Negative for bladder incontinence, difficulty urinating, dysuria, and frequency.  Musculoskeletal: Negative for back pain, gait problem, neck pain and neck stiffness. Skin: Negative for itching and rash. Neurological: Negative for dizziness, extremity weakness, gait problem, headaches, light-headedness and seizures. Hematological: Negative for adenopathy. Does not bruise/bleed easily. Psychiatric/Behavioral: Negative for confusion, depression and sleep disturbance. The patient is not nervous/anxious.      PHYSICAL EXAMINATION:  Blood pressure (!) 145/113, pulse 98, temperature 97.8 F (36.6 C), temperature source Temporal, resp. rate 18, height 5\' 7"  (1.702 m), weight 291 lb 12.8 oz (132.4 kg), SpO2 100 %.  ECOG PERFORMANCE STATUS: 0  Physical Exam  Constitutional: Oriented to person, place, and time and well-developed, well-nourished, and in no distress. HENT: Head: Normocephalic and atraumatic. Mouth/Throat: Oropharynx is clear and moist. No oropharyngeal exudate. Eyes: Conjunctivae are normal. Right eye exhibits no discharge. Left eye exhibits no discharge. No scleral icterus. Neck: Normal range of motion. Neck supple. Cardiovascular: Normal rate, regular rhythm, normal heart sounds and intact distal pulses.   Pulmonary/Chest: Effort normal and breath sounds normal. No respiratory  distress. No wheezes. No rales. Abdominal: Soft. Bowel sounds are normal. Exhibits no distension and no mass. There is no tenderness.  Musculoskeletal: Normal range of motion.  Lymphadenopathy:    No cervical adenopathy.  Neurological: Alert and oriented to person, place, and time. Exhibits normal muscle tone. Gait normal. Coordination normal. Skin: Skin is warm and dry. No rash noted. Not diaphoretic. No erythema. No pallor.  Psychiatric: Mood, memory and judgment normal. Vitals reviewed.  LABORATORY DATA: Lab Results  Component Value Date   WBC 11.4 (H) 11/02/2020   HGB 12.4 11/02/2020   HCT 37.8 11/02/2020   MCV 86.9 11/02/2020   PLT 449 (H) 11/02/2020      Chemistry      Component Value Date/Time   NA 138 08/24/2020 1257   NA 137 08/17/2020 1212   K 3.9 08/24/2020 1257   CL 105 08/24/2020 1257   CO2 27 08/24/2020 1257   BUN 11 08/24/2020 1257   BUN 15 08/17/2020 1212   CREATININE 0.94 08/24/2020 1257      Component Value Date/Time   CALCIUM 8.9 08/24/2020 1257   ALKPHOS 84 08/24/2020 1257   AST 16 08/24/2020 1257   ALT 7 08/24/2020 1257   BILITOT 0.8 08/24/2020 1257       RADIOGRAPHIC STUDIES:  No results found.   ASSESSMENT/PLAN:  This is a very pleasant 45 year old African-American female referred to the clinic for  iron deficiency anemia and thrombocytosis.  The patient had a repeat CBC today which showed improvement in her hemoglobin which was within normal limits today at 12.4.  Her platelet count continues to be slightly elevated at 449K.  The patient's iron studies show improvement in her iron deficiency with a ferritin which has increased from 7 to 88 today.  Her iron studies are also within normal limits today.  Recommended the patient continue with oral iron supplements and increasing the iron in her diet as she has been doing.  She does not need IV iron infusions at this time.  We will see her back for follow-up visit in 3 months for evaluation and  repeat CBC, iron studies, and ferritin.  The patient's blood pressure was elevated today we will recheck her blood pressure before she leaves the clinic.  The patient is asymptomatic and denies any headaches, lightheadedness, dizziness, visual changes, or chest discomfort.  Recommend for the patient to monitor her blood pressure at home.  She states that this is unusual for her to have high blood pressure readings.  Discussed that should she have elevated blood pressure consistently at home that she should follow-up with her PCP for antihypertensive management.  We will reschedule her missed genetic counseling appointment.   Additionally, the patient was strongly encouraged to return her stool cards to rule out GI blood loss, especially given her family history of GI malignancy. She states she will try to return them to the clinic next week.   The patient was advised to call immediately if she has any concerning symptoms in the interval. The patient voices understanding of current disease status and treatment options and is in agreement with the current care plan. All questions were answered. The patient knows to call the clinic with any problems, questions or concerns. We can certainly see the patient much sooner if necessary   Orders Placed This Encounter  Procedures   CBC with Differential (Cancer Center Only)    Standing Status:   Future    Standing Expiration Date:   11/02/2021   Ferritin    Standing Status:   Future    Standing Expiration Date:   11/02/2021   Iron and TIBC    Standing Status:   Future    Standing Expiration Date:   11/02/2021     The total time spent in the appointment was 20-29 minutes.  Aakash Hollomon L Aadil Sur, PA-C 11/02/20

## 2020-11-05 ENCOUNTER — Telehealth: Payer: Self-pay | Admitting: Physician Assistant

## 2020-11-05 NOTE — Telephone Encounter (Signed)
Scheduled per los. Called, not able to leave msg. Mailed printout  

## 2020-11-09 ENCOUNTER — Ambulatory Visit (HOSPITAL_COMMUNITY)
Admission: EM | Admit: 2020-11-09 | Discharge: 2020-11-09 | Disposition: A | Discharge status: Home / Self Care | Payer: 59 | Attending: Internal Medicine | Admitting: Internal Medicine

## 2020-11-09 ENCOUNTER — Encounter (HOSPITAL_COMMUNITY): Payer: Self-pay

## 2020-11-09 ENCOUNTER — Other Ambulatory Visit: Payer: Self-pay

## 2020-11-09 DIAGNOSIS — J4521 Mild intermittent asthma with (acute) exacerbation: Secondary | ICD-10-CM

## 2020-11-09 MED ORDER — GUAIFENESIN ER 600 MG PO TB12: 600.0000 mg | ORAL_TABLET | Freq: Two times a day (BID) | ORAL | 0 refills | Status: DC | PRN

## 2020-11-09 MED ORDER — BENZONATATE 100 MG PO CAPS: 100.0000 mg | ORAL_CAPSULE | Freq: Three times a day (TID) | ORAL | 0 refills | Status: DC

## 2020-11-09 MED ORDER — PREDNISONE 20 MG PO TABS: 20.0000 mg | ORAL_TABLET | Freq: Every day | ORAL | 0 refills | Status: AC

## 2020-11-09 MED ORDER — ALBUTEROL SULFATE HFA 108 (90 BASE) MCG/ACT IN AERS: 1.0000 | INHALATION_SPRAY | Freq: Four times a day (QID) | RESPIRATORY_TRACT | 0 refills | Status: DC | PRN

## 2020-11-09 NOTE — Discharge Instructions (Addendum)
Please take medications as prescribed If you have any worsening symptoms please return to urgent care to be reevaluated. 

## 2020-11-09 NOTE — ED Triage Notes (Signed)
Pt c/o dry cough on Saturday night, and yesterday asthma was affecting her. Pt states she is out of her inhaler, does get SOB with activity.

## 2020-11-10 NOTE — ED Provider Notes (Addendum)
[facility]    CSN: [id number] Arrival date & time: 11/09/20  1250      History   Chief Complaint Chief Complaint  Patient presents with   Asthma    HPI Carla Barton is a 45 y.o. female with a history of asthma comes to urgent care with 2-day history of nonproductive cough, chest tightness and wheezing.  Patient has shortness of breath both at rest and on exertion associated with wheezing.  No sputum production.  No fever or chills.  No sick contacts.  No nausea vomiting or diarrhea.  Patient denies any sore throat.  No diarrhea. Marland Kitchen   HPI  Past Medical History:  Diagnosis Date   Anemia    no current med.   Asthma    Closed fracture of middle phalanx of little finger 06/2017   right   History of asthma    no current med.   History of cervical cancer    Migraines     Patient Active Problem List   Diagnosis Date Noted   Iron deficiency anemia 08/24/2020   History of cervical cancer    Speech abnormality 01/10/2014   Anxiety in acute stress reaction 01/10/2014   Peritonsillar abscess 04/19/2013    Past Surgical History:  Procedure Laterality Date   CERVICAL BIOPSY  W/ LOOP ELECTRODE EXCISION     CHOLECYSTECTOMY     CLOSED REDUCTION FINGER WITH PERCUTANEOUS PINNING Right 07/06/2017   Procedure: CLOSED REDUCTION WITH PERCUTANEOUS PINNING RIGHT SMALL FINGER FRACTURE;  Surgeon: Betha Loa, MD;  Location: [facility];  Service: Orthopedics;  Laterality: Right;   TONSILLECTOMY Bilateral 04/19/2013   Procedure: TONSILLECTOMY;  Surgeon: Christia Reading, MD;  Location: [facility] OR;  Service: ENT;  Laterality: Bilateral;   TUBAL LIGATION      OB History     Gravida  3   Para  3   Term  3   Preterm      AB      Living  3      SAB      IAB      Ectopic      Multiple      Live Births               Home Medications    Prior to Admission medications   Medication Sig Start Date End Date Taking? Authorizing Provider  benzonatate  (TESSALON) 100 MG capsule Take 1 capsule (100 mg total) by mouth every 8 (eight) hours. 11/09/20  Yes Rishaan Gunner, Britta Mccreedy, MD  guaiFENesin (MUCINEX) 600 MG 12 hr tablet Take 1 tablet (600 mg total) by mouth 2 (two) times daily as needed. 11/09/20  Yes Shereda Graw, Britta Mccreedy, MD  predniSONE (DELTASONE) 20 MG tablet Take 1 tablet (20 mg total) by mouth daily for 5 days. 11/09/20 11/14/20 Yes Phyillis Dascoli, Britta Mccreedy, MD  albuterol (VENTOLIN HFA) 108 (90 Base) MCG/ACT inhaler Inhale 1 puff into the lungs every 6 (six) hours as needed for wheezing or shortness of breath. 11/09/20   Merrilee Jansky, MD  Cholecalciferol (VITAMIN D3 PO) Take 2,000 Doses by mouth.    [provider]  ferrous sulfate 324 MG TBEC Take 324 mg by mouth.    [provider]  Norethindrone Acetate-Ethinyl Estrad-FE (JUNEL FE 24) 1-20 MG-MCG(24) tablet Take 1 tablet by mouth daily. Continuous use to avoid menses re Iron deficiency anemia. 09/25/20   Genia Del, MD    Family History Family History  Problem Relation Age of  Onset   Cancer Mother        stomach   Hypertension Mother    Hypertension Father    Diabetes Father    Breast cancer Sister 72   Cancer Sister        Colon    Social History Social History   Tobacco Use   Smoking status: Never   Smokeless tobacco: Never  Vaping Use   Vaping Use: Never used  Substance Use Topics   Alcohol use: No   Drug use: No     Allergies   Aspirin and Shellfish allergy   Review of Systems Review of Systems  HENT:  Positive for congestion. Negative for sore throat.   Eyes: Negative.   Respiratory:  Positive for cough, shortness of breath and wheezing.   Cardiovascular:  Negative for chest pain.  Gastrointestinal: Negative.   Genitourinary: Negative.   Neurological: Negative.     Physical Exam Triage Vital Signs ED Triage Vitals  Enc Vitals Group     BP 11/09/20 1426 (!) 146/109     Pulse Rate 11/09/20 1426 (!) 104     Resp 11/09/20 1426 (!) 22     Temp  --      Temp src --      SpO2 11/09/20 1426 96 %     Weight --      Height --      Head Circumference --      Peak Flow --      Pain Score 11/09/20 1424 0     Pain Loc --      Pain Edu? --      Excl. in GC? --    No data found.  Updated Vital Signs BP (!) 146/109 (BP Location: Right Arm)   Pulse (!) 104   Resp (!) 22   LMP 10/29/2020   SpO2 96%   Visual Acuity Right Eye Distance:   Left Eye Distance:   Bilateral Distance:    Right Eye Near:   Left Eye Near:    Bilateral Near:     Physical Exam Vitals and nursing note reviewed.  HENT:     Nose: No rhinorrhea.     Mouth/Throat:     Pharynx: No posterior oropharyngeal erythema.  Cardiovascular:     Rate and Rhythm: Normal rate and regular rhythm.     Pulses: Normal pulses.     Heart sounds: Normal heart sounds.  Pulmonary:     Comments: Adequate air entry in the lung bases bilaterally.  Expiratory wheezing bilaterally. Abdominal:     General: Bowel sounds are normal.     Palpations: Abdomen is soft.     UC Treatments / Results  Labs (all labs ordered are listed, but only abnormal results are displayed) Labs Reviewed - No data to display  EKG   Radiology No results found.  Procedures Procedures (including critical care time)  Medications Ordered in UC Medications - No data to display  Initial Impression / Assessment and Plan / UC Course  I have reviewed the triage vital signs and the nursing notes.  Pertinent labs & imaging results that were available during my care of the patient were reviewed by me and considered in my medical decision making (see chart for details).     1.  Mild intermittent asthma with acute exacerbation: Albuterol inhaler Tessalon Perles as needed for cough Mucinex twice daily Return to urgent care if symptoms worsen No indication for chest x-ray since you have good air  movement Final Clinical Impressions(s) / UC Diagnoses   Final diagnoses:  Mild intermittent asthma  with (acute) exacerbation     Discharge Instructions      Please take medications as prescribed If you have any worsening symptoms please return to urgent care to be reevaluated.     ED Prescriptions     Medication Sig Dispense Auth. Provider   guaiFENesin (MUCINEX) 600 MG 12 hr tablet Take 1 tablet (600 mg total) by mouth 2 (two) times daily as needed. 20 tablet Nicko Daher, Britta Mccreedy, MD   albuterol (VENTOLIN HFA) 108 (90 Base) MCG/ACT inhaler Inhale 1 puff into the lungs every 6 (six) hours as needed for wheezing or shortness of breath. 18 g Carold Eisner, Britta Mccreedy, MD   benzonatate (TESSALON) 100 MG capsule Take 1 capsule (100 mg total) by mouth every 8 (eight) hours. 21 capsule Satish Hammers, Britta Mccreedy, MD   predniSONE (DELTASONE) 20 MG tablet Take 1 tablet (20 mg total) by mouth daily for 5 days. 5 tablet Asa Baudoin, Britta Mccreedy, MD      PDMP not reviewed this encounter.   Merrilee Jansky, MD 11/10/20 1625    Merrilee Jansky, MD 11/10/20 [phone]

## 2020-11-15 ENCOUNTER — Other Ambulatory Visit: Payer: Self-pay

## 2020-11-15 ENCOUNTER — Encounter (HOSPITAL_COMMUNITY): Payer: Self-pay

## 2020-11-15 ENCOUNTER — Emergency Department (HOSPITAL_COMMUNITY): Payer: 59

## 2020-11-15 ENCOUNTER — Emergency Department (HOSPITAL_COMMUNITY)
Admission: EM | Admit: 2020-11-15 | Discharge: 2020-11-15 | Disposition: A | Discharge status: Home / Self Care | Payer: 59 | Attending: Emergency Medicine | Admitting: Emergency Medicine

## 2020-11-15 DIAGNOSIS — M1310 Monoarthritis, not elsewhere classified, unspecified site: Secondary | ICD-10-CM

## 2020-11-15 DIAGNOSIS — J45909 Unspecified asthma, uncomplicated: Secondary | ICD-10-CM | POA: Diagnosis not present

## 2020-11-15 DIAGNOSIS — Z8541 Personal history of malignant neoplasm of cervix uteri: Secondary | ICD-10-CM | POA: Insufficient documentation

## 2020-11-15 DIAGNOSIS — M25521 Pain in right elbow: Secondary | ICD-10-CM | POA: Diagnosis present

## 2020-11-15 DIAGNOSIS — M13171 Monoarthritis, not elsewhere classified, right ankle and foot: Secondary | ICD-10-CM | POA: Diagnosis not present

## 2020-11-15 LAB — BASIC METABOLIC PANEL
Anion gap: 8 (ref 5–15)
BUN: 18 mg/dL (ref 6–20)
CO2: 28 mmol/L (ref 22–32)
Calcium: 9.2 mg/dL (ref 8.9–10.3)
Chloride: 100 mmol/L (ref 98–111)
Creatinine, Ser: 0.83 mg/dL (ref 0.44–1.00)
GFR, Estimated: >60 mL/min (ref >60)
Glucose, Bld: 91 mg/dL (ref 70–99)
Potassium: 4.6 mmol/L (ref 3.5–5.1)
Sodium: 136 mmol/L (ref 135–145)

## 2020-11-15 LAB — CBC WITH DIFFERENTIAL/PLATELET
Abs Immature Granulocytes: 0.14 10*3/uL — ABNORMAL HIGH (ref 0.00–0.07)
Basophils Absolute: 0.1 10*3/uL (ref 0.0–0.1)
Basophils Relative: 0 %
Eosinophils Absolute: 0.3 10*3/uL (ref 0.0–0.5)
Eosinophils Relative: 1 %
HCT: 38.6 % (ref 36.0–46.0)
Hemoglobin: 12.4 g/dL (ref 12.0–15.0)
Immature Granulocytes: 1 %
Lymphocytes Relative: 22 %
Lymphs Abs: 4.8 10*3/uL — ABNORMAL HIGH (ref 0.7–4.0)
MCH: 28.5 pg (ref 26.0–34.0)
MCHC: 32.1 g/dL (ref 30.0–36.0)
MCV: 88.7 fL (ref 80.0–100.0)
Monocytes Absolute: 0.9 10*3/uL (ref 0.1–1.0)
Monocytes Relative: 4 %
Neutro Abs: 15.5 10*3/uL — ABNORMAL HIGH (ref 1.7–7.7)
Neutrophils Relative %: 72 %
Platelets: 492 10*3/uL — ABNORMAL HIGH (ref 150–400)
RBC: 4.35 MIL/uL (ref 3.87–5.11)
RDW: 16.6 % — ABNORMAL HIGH (ref 11.5–15.5)
WBC: 21.6 10*3/uL — ABNORMAL HIGH (ref 4.0–10.5)
nRBC: 0 % (ref 0.0–0.2)

## 2020-11-15 LAB — SYNOVIAL CELL COUNT + DIFF, W/ CRYSTALS
Crystals, Fluid: NONE SEEN
Eosinophils-Synovial: 0 % (ref 0–1)
Lymphocytes-Synovial Fld: 2 % (ref 0–20)
Monocyte-Macrophage-Synovial Fluid: 51 % (ref 50–90)
Neutrophil, Synovial: 47 % — ABNORMAL HIGH (ref 0–25)
WBC, Synovial: 830 /mm3 — ABNORMAL HIGH (ref 0–200)

## 2020-11-15 LAB — SEDIMENTATION RATE: Sed Rate: 45 mm/hr — ABNORMAL HIGH (ref 0–22)

## 2020-11-15 MED ORDER — PREDNISONE 20 MG PO TABS: 40.0000 mg | ORAL_TABLET | Freq: Every day | ORAL | 0 refills | Status: DC

## 2020-11-15 MED ORDER — KETOROLAC TROMETHAMINE 15 MG/ML IJ SOLN
15.0000 mg | Freq: Once | INTRAMUSCULAR | Status: AC
  Administered 2020-11-15: 15 mg via INTRAVENOUS
  Filled 2020-11-15: qty 1

## 2020-11-15 MED ORDER — LIDOCAINE HCL (PF) 1 % IJ SOLN
5.0000 mL | Freq: Once | INTRAMUSCULAR | Status: AC
  Administered 2020-11-15: 5 mL
  Filled 2020-11-15: qty 30

## 2020-11-15 NOTE — ED Triage Notes (Signed)
Right arm pain/numbness with decreased ROM x1 day upon waking. Ibuprofen with no relief at 1800. Patient denies any lifting or injury.

## 2020-11-15 NOTE — Discharge Instructions (Addendum)
As discussed, your elbow pain evaluation has been somewhat reassuring.  There is currently no evidence for an infection.  Please take all medication as directed and monitor your condition.  Return here for concerning changes.  Otherwise follow-up with our orthopedic clinician in about 1 week.

## 2020-11-15 NOTE — Progress Notes (Signed)
Orthopedic Tech Progress Note Patient Details:  Carla Barton December 07, 1975 [id number]  Sling and Swath unable be to applied properly due to pt body habitus. I spoke with Dr. Jeraldine Loots about this and decided to use a shoulder immobilizer instead as it accommodated the pt better.  Ortho Devices Type of Ortho Device: Shoulder immobilizer Ortho Device/Splint Location: RUE Ortho Device/Splint Interventions: Application, Adjustment, Ordered   Post Interventions Patient Tolerated: Well Instructions Provided: Care of device, Adjustment of device  Carla Barton 11/15/2020, 10:25 AM

## 2020-11-15 NOTE — ED Provider Notes (Signed)
[facility] Provider Note   CSN: [id number] Arrival date & time: 11/15/20  0257     History Chief Complaint  Patient presents with   Extremity Weakness    Carla Barton is a 45 y.o. female.  HPI Patient presents with elbow pain.  Onset was 2 days ago, no obvious precipitant.  Since that time she has had worsening pain in the elbow, now with radiation superiorly and inferiorly with motion, whereas initially it was isolated to the elbow.  Pain is worse with palpation, motion, is sore, severe, not improved with OTC medication use. She notes that she is generally well, has no recent illness including pharyngitis, COVID, UTI-like symptoms. No history of gout, arthritis.  No concurrent fevers, chills, nausea, vomiting, or other complaints.    Past Medical History:  Diagnosis Date   Anemia    no current med.   Asthma    Closed fracture of middle phalanx of little finger 06/2017   right   History of asthma    no current med.   History of cervical cancer    Migraines     Patient Active Problem List   Diagnosis Date Noted   Iron deficiency anemia 08/24/2020   History of cervical cancer    Speech abnormality 01/10/2014   Anxiety in acute stress reaction 01/10/2014   Peritonsillar abscess 04/19/2013    Past Surgical History:  Procedure Laterality Date   CERVICAL BIOPSY  W/ LOOP ELECTRODE EXCISION     CHOLECYSTECTOMY     CLOSED REDUCTION FINGER WITH PERCUTANEOUS PINNING Right 07/06/2017   Procedure: CLOSED REDUCTION WITH PERCUTANEOUS PINNING RIGHT SMALL FINGER FRACTURE;  Surgeon: Betha Loa, MD;  Location: [facility];  Service: Orthopedics;  Laterality: Right;   TONSILLECTOMY Bilateral 04/19/2013   Procedure: TONSILLECTOMY;  Surgeon: Christia Reading, MD;  Location: [facility] OR;  Service: ENT;  Laterality: Bilateral;   TUBAL LIGATION       OB History     Gravida  3   Para  3   Term  3   Preterm      AB      Living  3       SAB      IAB      Ectopic      Multiple      Live Births              Family History  Problem Relation Age of Onset   Cancer Mother        stomach   Hypertension Mother    Hypertension Father    Diabetes Father    Breast cancer Sister 10   Cancer Sister        Colon    Social History   Tobacco Use   Smoking status: Never   Smokeless tobacco: Never  Vaping Use   Vaping Use: Never used  Substance Use Topics   Alcohol use: No   Drug use: No    Home Medications Prior to Admission medications   Medication Sig Start Date End Date Taking? Authorizing Provider  albuterol (VENTOLIN HFA) 108 (90 Base) MCG/ACT inhaler Inhale 1 puff into the lungs every 6 (six) hours as needed for wheezing or shortness of breath. 11/09/20   Lamptey, Britta Mccreedy, MD  benzonatate (TESSALON) 100 MG capsule Take 1 capsule (100 mg total) by mouth every 8 (eight) hours. 11/09/20   Merrilee Jansky, MD  Cholecalciferol (VITAMIN D3 PO) Take 2,000 Doses by mouth.  [provider]  ferrous sulfate 324 MG TBEC Take 324 mg by mouth.    [provider]  guaiFENesin (MUCINEX) 600 MG 12 hr tablet Take 1 tablet (600 mg total) by mouth 2 (two) times daily as needed. 11/09/20   LampteyBritta Mccreedy, MD  Norethindrone Acetate-Ethinyl Estrad-FE (JUNEL FE 24) 1-20 MG-MCG(24) tablet Take 1 tablet by mouth daily. Continuous use to avoid menses re Iron deficiency anemia. 09/25/20   Genia Del, MD    Allergies    Aspirin and Shellfish allergy  Review of Systems   Review of Systems  Constitutional:        Per HPI, otherwise negative  HENT:         Per HPI, otherwise negative  Respiratory:         Per HPI, otherwise negative  Cardiovascular:        Per HPI, otherwise negative  Gastrointestinal:  Negative for vomiting.  Endocrine:       Negative aside from HPI  Genitourinary:        Neg aside from HPI   Musculoskeletal:        Per HPI, otherwise negative  Skin: Negative.    Neurological:  Negative for syncope.   Physical Exam Updated Vital Signs BP (!) 182/112 (BP Location: Left Arm)   Pulse 100   Temp 97.8 F (36.6 C) (Oral)   Resp 18   Ht 5\' 7"  (1.702 m)   Wt 136 kg   LMP 10/26/2020   SpO2 96%   BMI 46.96 kg/m   Physical Exam Vitals and nursing note reviewed.  Constitutional:      General: She is not in acute distress.    Appearance: She is well-developed.  HENT:     Head: Normocephalic and atraumatic.  Eyes:     Conjunctiva/sclera: Conjunctivae normal.  Cardiovascular:     Rate and Rhythm: Normal rate and regular rhythm.  Pulmonary:     Effort: Pulmonary effort is normal. No respiratory distress.     Breath sounds: Normal breath sounds. No stridor.  Abdominal:     General: There is no distension.  Musculoskeletal:     Comments: In isolation shoulder and wrist evaluation are both unremarkable.  Elbow is not warm, erythematous, but is tender to palpation posteriorly.  Patient is hesitant to move it substantially secondary to pain.  No deformity.  Skin:    General: Skin is warm and dry.  Neurological:     Mental Status: She is alert and oriented to person, place, and time.     Cranial Nerves: No cranial nerve deficit.    ED Results / Procedures / Treatments   Labs (all labs ordered are listed, but only abnormal results are displayed) Labs Reviewed  BASIC METABOLIC PANEL  CBC WITH DIFFERENTIAL/PLATELET  SEDIMENTATION RATE  C-REACTIVE PROTEIN    EKG None  Radiology DG Shoulder Right  Result Date: 11/15/2020 CLINICAL DATA:  45 year old female with history of severe pain and tightness in the right arm for several days. EXAM: RIGHT SHOULDER - 2+ VIEW COMPARISON:  No priors. FINDINGS: There is no evidence of fracture or dislocation. There is no evidence of arthropathy or other focal bone abnormality. Tiny soft tissue calcification superolateral to the right humeral head, likely within the rotator cuff tendons. IMPRESSION: 1. No acute  radiographic abnormality of the right shoulder. Electronically Signed   By: 57 M.D.   On: 11/15/2020 05:41   DG Elbow Complete Right  Result Date: 11/15/2020 CLINICAL DATA:  Atraumatic right arm pain for 3 days. EXAM: RIGHT ELBOW - COMPLETE 3+ VIEW COMPARISON:  None. FINDINGS: Limited frontal positioning due to difficulty with patient pain in positioning. There is no evidence of fracture, dislocation, or joint effusion. IMPRESSION: 1. Limited study due to patient's symptoms. 2. No explanation for pain. Electronically Signed   By: Marnee SpringJonathon  Watts M.D.   On: 11/15/2020 05:41    Procedures .Joint Aspiration/Arthrocentesis  Date/Time: 11/15/2020 12:40 PM Performed by: Gerhard MunchLockwood, Maille Halliwell, MD Authorized by: Gerhard MunchLockwood, Roey Coopman, MD   Consent:    Consent obtained:  Verbal   Consent given by:  Patient   Risks, benefits, and alternatives were discussed: yes     Risks discussed:  Bleeding, infection and pain   Alternatives discussed:  No treatment and alternative treatment Universal protocol:    Procedure explained and questions answered to patient or proxy's satisfaction: yes     Relevant documents present and verified: yes     Test results available: yes     Imaging studies available: yes     Required blood products, implants, devices, and special equipment available: yes     Site/side marked: yes     Immediately prior to procedure, a time out was called: yes     Patient identity confirmed:  Verbally with patient Location:    Location:  Elbow   Elbow:  R elbow Anesthesia:    Anesthesia method:  Local infiltration   Local anesthetic:  Lidocaine 1% w/o epi Procedure details:    Preparation: Patient was prepped and draped in usual sterile fashion     Needle gauge:  18 G   Ultrasound guidance: no     Approach:  Lateral   Aspirate amount:  4   Aspirate characteristics:  Yellow   Steroid injected: no     Specimen collected: yes   Post-procedure details:    Dressing:  Gauze roll    Procedure completion:  Tolerated   Medications Ordered in ED Medications  ketorolac (TORADOL) 15 MG/ML injection 15 mg (has no administration in time range)    ED Course  I have reviewed the triage vital signs and the nursing notes.  Pertinent labs & imaging results that were available during my care of the patient were reviewed by me and considered in my medical decision making (see chart for details).  With leukocytosis, elevated sed rate ongoing pain I discussed with the patient indication for arthrocentesis, she is amenable.  3:02 PM Arthrocentesis results largely available, notable for no substantial leukocytes, negative crystals.  Patient's pain has improved she is wearing sling.  We discussed all findings, including concern for inflammatory arthropathy, possibly reactive versus pseudogout.  Patient amenable to initiating medications here, following with orthopedics.  No evidence for other phenomena, bacteremia, sepsis, and she is neurovascular intact distally. 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   Amount and/or Complexity of Data Reviewed Clinical lab tests: ordered and reviewed Tests in the radiology section of CPT: ordered and reviewed Tests in the medicine section of CPT: reviewed and ordered Decide to obtain previous medical records or to obtain history from someone other than the patient: yes Review and summarize past medical records: yes Independent visualization of images, tracings, or specimens: yes  Risk of Complications, Morbidity, and/or Mortality Presenting problems: high Diagnostic procedures: high Management options: high  Critical Care Total time providing critical care: < 30 minutes  Patient Progress Patient progress: improved   Final Clinical Impression(s) / ED Diagnoses  Final diagnoses:  Monoarticular arthritis    Rx / DC Orders ED Discharge Orders           Ordered    predniSONE (DELTASONE) 20 MG tablet  Daily with breakfast        11/15/20 1504             Gerhard Munch, MD 11/15/20 1504

## 2020-11-15 NOTE — ED Notes (Signed)
ED Provider at bedside. 

## 2020-11-16 LAB — URIC ACID, BODY FLUID: Uric Acid Body Fluid: 4 mg/dL

## 2020-11-16 LAB — GLUCOSE, BODY FLUID OTHER: Glucose, Body Fluid Other: 80 mg/dL

## 2020-11-16 LAB — PROTEIN, BODY FLUID (OTHER): Total Protein, Body Fluid Other: 2.7 g/dL

## 2020-11-18 LAB — BODY FLUID CULTURE W GRAM STAIN
Culture: NO GROWTH
Special Requests: NORMAL

## 2020-11-26 ENCOUNTER — Other Ambulatory Visit: Payer: Self-pay | Admitting: Genetic Counselor

## 2020-11-26 ENCOUNTER — Other Ambulatory Visit: Payer: 59

## 2020-11-26 ENCOUNTER — Encounter: Payer: 59 | Admitting: Genetic Counselor

## 2020-11-26 DIAGNOSIS — Z800 Family history of malignant neoplasm of digestive organs: Secondary | ICD-10-CM

## 2020-11-26 DIAGNOSIS — Z803 Family history of malignant neoplasm of breast: Secondary | ICD-10-CM

## 2020-11-26 NOTE — Addendum Note (Signed)
Addended by: Christiane Ha on: 11/26/2020 10:39 AM   Modules accepted: Orders

## 2021-01-26 NOTE — Progress Notes (Signed)
[facility] OFFICE PROGRESS NOTE  Henson, Vickie L, PA-C No address on file  DIAGNOSIS:  Iron deficiency anemia and thrombocytosis  PRIOR THERAPY: None  CURRENT THERAPY: 1) PRN IV iron with venofer 300 mg. Last dose on 09/21/20. 2) Oral iron supplements BID p.o. daily   INTERVAL HISTORY: Carla Barton 45 y.o. female returns to the clinic today for a follow-up visit.  The patient was last seen in the clinic on 11/02/2020. She had improvement in her iron studies at that time. She continues to take an iron supplement daily and is compliant with this.  She is also been juicing with celery, kale, pineapple, and ginger daily.  She also has been avoiding taking her iron supplement with tea.   Her fatigue is improved. She denies any chest pain, shortness of breath, lightheadedness, or any abnormal bleeding or bruising except she has occassional gingial bleeding. She is going to try to find a dentist.. She deneis heavy menstral bleeding but a few months ago her GYN wants to help prevent worsening of her anemia/iron deficiency and they also placed her on hormone/birth control to help eliminate her periods which has been successful.   Additionally, the patient never completed her stool cards which were given to her at her last appointment to rule out GI blood loss.  The patient did have a colonoscopy approximately 3 years ago due to her family history of colorectal cancer which was reportedly normal. She is going to try to do these next week and return them to the clinic. This was performed out of state in [place].  She also reportedly had an EGD at this time which is also reportedly normal.   She is going to try to be more active and is going to try to lose weight. She is here today for evaluation and repeat blood work.  MEDICAL HISTORY: Past Medical History:  Diagnosis Date   Anemia    no current med.   Asthma    Closed fracture of middle phalanx of little finger 06/2017   right    History of asthma    no current med.   History of cervical cancer    Migraines     ALLERGIES:  is allergic to aspirin and shellfish allergy.  MEDICATIONS:  Current Outpatient Medications  Medication Sig Dispense Refill   albuterol (VENTOLIN HFA) 108 (90 Base) MCG/ACT inhaler Inhale 1 puff into the lungs every 6 (six) hours as needed for wheezing or shortness of breath. 18 g 0   benzonatate (TESSALON) 100 MG capsule Take 1 capsule (100 mg total) by mouth every 8 (eight) hours. (Patient taking differently: Take 100 mg by mouth every 8 (eight) hours as needed for cough.) 21 capsule 0   ferrous sulfate 324 MG TBEC Take 324 mg by mouth daily.     guaiFENesin (MUCINEX) 600 MG 12 hr tablet Take 1 tablet (600 mg total) by mouth 2 (two) times daily as needed. (Patient taking differently: Take 600 mg by mouth 2 (two) times daily as needed for cough.) 20 tablet 0   Norethindrone Acetate-Ethinyl Estrad-FE (JUNEL FE 24) 1-20 MG-MCG(24) tablet Take 1 tablet by mouth daily. Continuous use to avoid menses re Iron deficiency anemia. (Patient taking differently: Take 1 tablet by mouth daily.) 84 tablet 4   Vitamin D, Ergocalciferol, (DRISDOL) 1.25 MG (50000 UNIT) CAPS capsule Take 50,000 Units by mouth every 7 (seven) days. Wednesday's     No current facility-administered medications for this visit.  SURGICAL HISTORY:  Past Surgical History:  Procedure Laterality Date   CERVICAL BIOPSY  W/ LOOP ELECTRODE EXCISION     CHOLECYSTECTOMY     CLOSED REDUCTION FINGER WITH PERCUTANEOUS PINNING Right 07/06/2017   Procedure: CLOSED REDUCTION WITH PERCUTANEOUS PINNING RIGHT SMALL FINGER FRACTURE;  Surgeon: Betha Loa, MD;  Location: [facility];  Service: Orthopedics;  Laterality: Right;   TONSILLECTOMY Bilateral 04/19/2013   Procedure: TONSILLECTOMY;  Surgeon: Christia Reading, MD;  Location: [facility] OR;  Service: ENT;  Laterality: Bilateral;   TUBAL LIGATION      REVIEW OF SYSTEMS:   Review of  Systems  Constitutional: Negative for appetite change, chills, fatigue, fever and unexpected weight change.  HENT: Positive for occasional gingival bleeding with brushing her teeth. Negative for mouth sores, nosebleeds, sore throat and trouble swallowing.   Eyes: Negative for eye problems and icterus.  Respiratory: Negative for cough, hemoptysis, shortness of breath and wheezing.   Cardiovascular: Negative for chest pain and leg swelling.  Gastrointestinal: Negative for abdominal pain, constipation, diarrhea, nausea and vomiting.  Genitourinary: Negative for bladder incontinence, difficulty urinating, dysuria, frequency and hematuria.   Musculoskeletal: Negative for back pain, gait problem, neck pain and neck stiffness.  Skin: Negative for itching and rash.  Neurological: Negative for dizziness, extremity weakness, gait problem, headaches, light-headedness and seizures.  Hematological: Negative for adenopathy. Does not bruise/bleed easily.  Psychiatric/Behavioral: Negative for confusion, depression and sleep disturbance. The patient is not nervous/anxious.     PHYSICAL EXAMINATION:  Blood pressure (!) 142/100, pulse 82, temperature 97.9 F (36.6 C), temperature source Oral, resp. rate 17, height 5\' 7"  (1.702 m), weight 292 lb 6.4 oz (132.6 kg), SpO2 100 %.  ECOG PERFORMANCE STATUS: 1  Physical Exam  Constitutional: Oriented to person, place, and time and well-developed, well-nourished, and in no distress.  HENT:  Head: Normocephalic and atraumatic.  Mouth/Throat: Oropharynx is clear and moist. No oropharyngeal exudate.  Eyes: Conjunctivae are normal. Right eye exhibits no discharge. Left eye exhibits no discharge. No scleral icterus.  Neck: Normal range of motion. Neck supple.  Cardiovascular: Normal rate, regular rhythm, normal heart sounds and intact distal pulses.   Pulmonary/Chest: Effort normal and breath sounds normal. No respiratory distress. No wheezes. No rales.  Abdominal:  Soft. Bowel sounds are normal. Exhibits no distension and no mass. There is no tenderness.  Musculoskeletal: Normal range of motion. Exhibits no edema.  Lymphadenopathy:    No cervical adenopathy.  Neurological: Alert and oriented to person, place, and time. Exhibits normal muscle tone. Gait normal. Coordination normal.  Skin: Skin is warm and dry. No rash noted. Not diaphoretic. No erythema. No pallor.  Psychiatric: Mood, memory and judgment normal.  Vitals reviewed.  LABORATORY DATA: Lab Results  Component Value Date   WBC 11.7 (H) 02/02/2021   HGB 13.0 02/02/2021   HCT 39.8 02/02/2021   MCV 90.9 02/02/2021   PLT 406 (H) 02/02/2021      Chemistry      Component Value Date/Time   NA 136 11/15/2020 0927   NA 137 08/17/2020 1212   K 4.6 11/15/2020 0927   CL 100 11/15/2020 0927   CO2 28 11/15/2020 0927   BUN 18 11/15/2020 0927   BUN 15 08/17/2020 1212   CREATININE 0.83 11/15/2020 0927   CREATININE 0.94 08/24/2020 1257      Component Value Date/Time   CALCIUM 9.2 11/15/2020 0927   ALKPHOS 84 08/24/2020 1257   AST 16 08/24/2020 1257   ALT 7 08/24/2020 1257  BILITOT 0.8 08/24/2020 1257       RADIOGRAPHIC STUDIES:  No results found.   ASSESSMENT/PLAN:  This is a very pleasant 45 year old African-American female referred to the clinic for iron deficiency anemia and thrombocytosis.   The patient had a repeat CBC today which showed improvement in her hemoglobin which was within normal limits today at 13.0  Her platelet count continues to be slightly elevated at 406K but improved compared to prior.  The patient's iron studies show improvement in her iron deficiency with a ferritin is normal at 50 today.  Her iron studies are within normal limits today.   Recommended the patient continue with oral iron supplements and increasing the iron in her diet as she has been doing.  She does not need IV iron infusions at this time.  We will see her back for follow-up visit in 6  months for evaluation and repeat CBC, iron studies, and ferritin. If her iron is stable in 6 months, we can consider releasing her to her PCP.   Additionally, the patient was strongly encouraged to return her stool cards to rule out GI blood loss, especially given her family history of GI malignancy. She states she will try to return them to the clinic next week.   Advised to practice good oral hygiene to prevent gingival bleeding and to have regular follow ups with her dentist.   Her blood pressure has been elevated for the last few visits. Advised her to purchase a blood pressure cuff and check her BP a few times a week and keep a log of her readings. If it continues to be elevated, then advised to see her PCP for blood pressure management.   The patient was advised to call immediately if she has any concerning symptoms in the interval. The patient voices understanding of current disease status and treatment options and is in agreement with the current care plan. All questions were answered. The patient knows to call the clinic with any problems, questions or concerns. We can certainly see the patient much sooner if necessary          Orders Placed This Encounter  Procedures   CBC with Differential (Cancer Center Only)    Standing Status:   Future    Standing Expiration Date:   02/02/2022   Ferritin    Standing Status:   Future    Standing Expiration Date:   02/02/2022   Iron and TIBC    Standing Status:   Future    Standing Expiration Date:   02/02/2022     The total time spent in the appointment was 20-29 minutes.   Veronika Heard L Wyoma Genson, PA-C 02/02/21

## 2021-02-02 ENCOUNTER — Inpatient Hospital Stay: Payer: 59

## 2021-02-02 ENCOUNTER — Inpatient Hospital Stay: Payer: 59 | Attending: Physician Assistant | Admitting: Physician Assistant

## 2021-02-02 ENCOUNTER — Other Ambulatory Visit: Payer: Self-pay

## 2021-02-02 ENCOUNTER — Encounter: Payer: Self-pay | Admitting: Physician Assistant

## 2021-02-02 VITALS — BP 142/100 | HR 82 | Temp 97.9°F | Resp 17 | Ht 67.0 in | Wt 292.4 lb

## 2021-02-02 DIAGNOSIS — D509 Iron deficiency anemia, unspecified: Secondary | ICD-10-CM | POA: Insufficient documentation

## 2021-02-02 DIAGNOSIS — D75839 Thrombocytosis, unspecified: Secondary | ICD-10-CM | POA: Diagnosis not present

## 2021-02-02 LAB — CBC WITH DIFFERENTIAL (CANCER CENTER ONLY)
Abs Immature Granulocytes: 0.03 10*3/uL (ref 0.00–0.07)
Basophils Absolute: 0 10*3/uL (ref 0.0–0.1)
Basophils Relative: 0 %
Eosinophils Absolute: 0.2 10*3/uL (ref 0.0–0.5)
Eosinophils Relative: 2 %
HCT: 39.8 % (ref 36.0–46.0)
Hemoglobin: 13 g/dL (ref 12.0–15.0)
Immature Granulocytes: 0 %
Lymphocytes Relative: 29 %
Lymphs Abs: 3.4 10*3/uL (ref 0.7–4.0)
MCH: 29.7 pg (ref 26.0–34.0)
MCHC: 32.7 g/dL (ref 30.0–36.0)
MCV: 90.9 fL (ref 80.0–100.0)
Monocytes Absolute: 0.5 10*3/uL (ref 0.1–1.0)
Monocytes Relative: 4 %
Neutro Abs: 7.6 10*3/uL (ref 1.7–7.7)
Neutrophils Relative %: 65 %
Platelet Count: 406 10*3/uL — ABNORMAL HIGH (ref 150–400)
RBC: 4.38 MIL/uL (ref 3.87–5.11)
RDW: 12.7 % (ref 11.5–15.5)
WBC Count: 11.7 10*3/uL — ABNORMAL HIGH (ref 4.0–10.5)
nRBC: 0 % (ref 0.0–0.2)

## 2021-02-02 LAB — FERRITIN: Ferritin: 50 ng/mL (ref 11–307)

## 2021-02-02 LAB — IRON AND TIBC
Iron: 69 ug/dL (ref 41–142)
Saturation Ratios: 21 % (ref 21–57)
TIBC: 334 ug/dL (ref 236–444)
UIBC: 265 ug/dL (ref 120–384)

## 2021-04-08 ENCOUNTER — Encounter: Payer: Self-pay | Admitting: Physician Assistant

## 2021-05-27 ENCOUNTER — Telehealth: Payer: Self-pay

## 2021-05-27 NOTE — Telephone Encounter (Signed)
Patient left message in voice mail stating that with the bcp she takes continuously she has not had bleeding since last June. However she has recently started to spot every few days. What to rec?

## 2021-06-01 NOTE — Telephone Encounter (Signed)
Patient called back today because she had not heard a reply.  Please advise what to recommend.

## 2021-06-02 NOTE — Telephone Encounter (Signed)
Dr. Mackey Birchwood recommended "Stop the BCP x 5 days, then restart continuously.  If still having daily vaginal bleeding in the coming 3 packs, schedule a pelvic US."  I spoke with patient and informed her. She is agreeable to plan.

## 2021-10-04 ENCOUNTER — Ambulatory Visit: Payer: Self-pay | Admitting: Obstetrics & Gynecology

## 2021-10-21 ENCOUNTER — Encounter: Payer: Self-pay | Admitting: Obstetrics & Gynecology

## 2021-10-21 ENCOUNTER — Other Ambulatory Visit (HOSPITAL_COMMUNITY)
Admission: RE | Admit: 2021-10-21 | Discharge: 2021-10-21 | Disposition: A | Discharge status: Home / Self Care | Payer: Commercial Managed Care - HMO | Source: Ambulatory Visit | Attending: Obstetrics & Gynecology | Admitting: Obstetrics & Gynecology

## 2021-10-21 ENCOUNTER — Ambulatory Visit (INDEPENDENT_AMBULATORY_CARE_PROVIDER_SITE_OTHER): Payer: Commercial Managed Care - HMO | Admitting: Obstetrics & Gynecology

## 2021-10-21 ENCOUNTER — Encounter: Payer: Self-pay | Admitting: Physician Assistant

## 2021-10-21 VITALS — BP 118/82 | HR 89 | Ht 66.25 in | Wt 292.0 lb

## 2021-10-21 DIAGNOSIS — Z01419 Encounter for gynecological examination (general) (routine) without abnormal findings: Secondary | ICD-10-CM | POA: Diagnosis present

## 2021-10-21 DIAGNOSIS — Z9851 Tubal ligation status: Secondary | ICD-10-CM

## 2021-10-21 DIAGNOSIS — Z6841 Body Mass Index (BMI) 40.0 and over, adult: Secondary | ICD-10-CM

## 2021-10-21 DIAGNOSIS — Z8541 Personal history of malignant neoplasm of cervix uteri: Secondary | ICD-10-CM | POA: Diagnosis present

## 2021-10-21 NOTE — Progress Notes (Signed)
Carla Barton [birth year] [id number]   History:    46 y.o. G3P3L3 G3P3L3 Boyfriend.  S/P BTL. Son 1 yo [employer], son 20 yo working, daughter 53 yo graduated HS.   RP:  Established patient presenting for annual gyn exam    HPI: Menses regular normal.  No BTB.   Last Hb 13.0 in 01/2021.  No pelvic pain.  H/O LEEP 4 years ago for early Cervical Ca.  Last Pap test Negative in 09/2020.  Pap reflex today.  Breasts normal.  No recent Mammo, will schedule now.  BMI 46.78.  Lower calorie/carb diet.  Increase fitness activities.  COLONOSCOPY: 2019.  Health labs with Fam MD.  Past medical history,surgical history, family history and social history were all reviewed and documented in the EPIC chart.  Gynecologic History Patient's last menstrual period was 10/13/2021 (exact date).  Obstetric History OB History  Gravida Para Term Preterm AB Living  3 3 3     3   SAB IAB Ectopic Multiple Live Births               # Outcome Date GA Lbr Len/2nd Weight Sex Delivery Anes PTL Lv  3 Term           2 Term           1 Term              ROS: A ROS was performed and pertinent positives and negatives are included in the history.  GENERAL: No fevers or chills. HEENT: No change in vision, no earache, sore throat or sinus congestion. NECK: No pain or stiffness. CARDIOVASCULAR: No chest pain or pressure. No palpitations. PULMONARY: No shortness of breath, cough or wheeze. GASTROINTESTINAL: No abdominal pain, nausea, vomiting or diarrhea, melena or bright red blood per rectum. GENITOURINARY: No urinary frequency, urgency, hesitancy or dysuria. MUSCULOSKELETAL: No joint or muscle pain, no back pain, no recent trauma. DERMATOLOGIC: No rash, no itching, no lesions. ENDOCRINE: No polyuria, polydipsia, no heat or cold intolerance. No recent change in weight. HEMATOLOGICAL: No anemia or easy bruising or bleeding. NEUROLOGIC: No headache, seizures, numbness, tingling or weakness. PSYCHIATRIC: No depression, no loss of  interest in normal activity or change in sleep pattern.     Exam:   BP 118/82   Pulse 89   Ht 5' 6.25" (1.683 m)   Wt 292 lb (132.5 kg)   LMP 10/13/2021 (Exact Date)   SpO2 96%   BMI 46.78 kg/m   Body mass index is 46.78 kg/m.  General appearance : Well developed well nourished female. No acute distress HEENT: Eyes: no retinal hemorrhage or exudates,  Neck supple, trachea midline, no carotid bruits, no thyroidmegaly Lungs: Clear to auscultation, no rhonchi or wheezes, or rib retractions  Heart: Regular rate and rhythm, no murmurs or gallops Breast:Examined in sitting and supine position were symmetrical in appearance, no palpable masses or tenderness,  no skin retraction, no nipple inversion, no nipple discharge, no skin discoloration, no axillary or supraclavicular lymphadenopathy Abdomen: no palpable masses or tenderness, no rebound or guarding Extremities: no edema or skin discoloration or tenderness  Pelvic: Vulva: Normal             Vagina: No gross lesions or discharge  Cervix: No gross lesions or discharge.  Pap reflex done.  Uterus  AV, normal size, shape and consistency, non-tender and mobile  Adnexa  Without masses or tenderness  Anus: Normal   Assessment/Plan:  46 y.o. female for annual exam  1. Encounter for routine gynecological examination with Papanicolaou smear of cervix Menses regular normal.  No BTB.   Last Hb 13.0 in 01/2021.  No pelvic pain.  H/O LEEP 4 years ago for early Cervical Ca.  Last Pap test Negative in 09/2020.  Pap reflex today.  Breasts normal.  No recent Mammo, will schedule now.  BMI 46.78.  Lower calorie/carb diet.  Increase fitness activities.  COLONOSCOPY: 2019.  Health labs with Fam MD. - Cytology - PAP( [place])  2. History of cervical cancer - Cytology - PAP( )  3. S/P tubal ligation  4. Class 3 severe obesity due to excess calories without serious comorbidity with body mass index (BMI) of 45.0 to 49.9 in adult (HCC)   BMI 46.78.  Lower calorie/carb diet.  Increase fitness activities.   Genia Del MD, 1:53 PM 10/21/2021

## 2021-10-25 LAB — CYTOLOGY - PAP: Diagnosis: NEGATIVE

## 2021-10-27 ENCOUNTER — Encounter: Payer: Self-pay | Admitting: Physician Assistant

## 2021-12-08 ENCOUNTER — Other Ambulatory Visit: Payer: Self-pay | Admitting: Obstetrics & Gynecology

## 2021-12-08 DIAGNOSIS — Z1231 Encounter for screening mammogram for malignant neoplasm of breast: Secondary | ICD-10-CM

## 2022-01-03 ENCOUNTER — Ambulatory Visit: Payer: Commercial Managed Care - HMO

## 2022-01-30 NOTE — Progress Notes (Deleted)
[facility] OFFICE PROGRESS NOTE  Avanell Shackleton, NP-C [address] [place] [address]  DIAGNOSIS:  Iron deficiency anemia and thrombocytosis  PRIOR THERAPY: None  CURRENT THERAPY: 1) PRN IV iron with venofer 300 mg. Last dose on 09/21/20. 2) Oral iron supplements BID p.o. daily   INTERVAL HISTORY: Carla Barton 46 y.o. female returns to the clinic today for a follow-up visit.  The patient was last seen in the clinic about 1 year ago. She had improvement in her iron studies at that time. She continues to take an iron supplement daily and is compliant with this.  She is also been juicing with celery, kale, pineapple, and ginger daily.  She also has been avoiding taking her iron supplement with tea.   Her fatigue is improved. She denies any chest pain, shortness of breath, lightheadedness, or any abnormal bleeding or bruising except she has occassional gingial bleeding*** She deneis heavy menstral bleeding but a few months ago her GYN wants to help prevent worsening of her anemia/iron deficiency and they also placed her on hormone/birth control to help eliminate her periods which has been successful.   Additionally, the patient never completed her stool cards which were given to her at her initial appointment to rule out GI blood loss.  The patient did have a colonoscopy approximately 4 years ago due to her family history of colorectal cancer which was reportedly normal. She is going to try to do these next week and return them to the clinic. This was performed out of state in [place].  She also reportedly had an EGD at this time which is also reportedly normal.    She is going to try to be more active and is going to try to lose weight. She is here today for evaluation and repeat blood work.      MEDICAL HISTORY: Past Medical History:  Diagnosis Date   Anemia    no current med.   Asthma    Closed fracture of middle phalanx of little finger 06/2017   right    History of asthma    no current med.   History of cervical cancer    Migraines     ALLERGIES:  is allergic to aspirin and shellfish allergy.  MEDICATIONS:  Current Outpatient Medications  Medication Sig Dispense Refill   albuterol (VENTOLIN HFA) 108 (90 Base) MCG/ACT inhaler Inhale 1 puff into the lungs every 6 (six) hours as needed for wheezing or shortness of breath. 18 g 0   ferrous sulfate 324 MG TBEC Take 324 mg by mouth daily.     No current facility-administered medications for this visit.    SURGICAL HISTORY:  Past Surgical History:  Procedure Laterality Date   CERVICAL BIOPSY  W/ LOOP ELECTRODE EXCISION     CHOLECYSTECTOMY     CLOSED REDUCTION FINGER WITH PERCUTANEOUS PINNING Right 07/06/2017   Procedure: CLOSED REDUCTION WITH PERCUTANEOUS PINNING RIGHT SMALL FINGER FRACTURE;  Surgeon: Betha Loa, MD;  Location: [facility];  Service: Orthopedics;  Laterality: Right;   TONSILLECTOMY Bilateral 04/19/2013   Procedure: TONSILLECTOMY;  Surgeon: Christia Reading, MD;  Location: [facility] OR;  Service: ENT;  Laterality: Bilateral;   TUBAL LIGATION      REVIEW OF SYSTEMS:   Review of Systems  Constitutional: Negative for appetite change, chills, fatigue, fever and unexpected weight change.  HENT:   Negative for mouth sores, nosebleeds, sore throat and trouble swallowing.   Eyes: Negative for eye problems and  icterus.  Respiratory: Negative for cough, hemoptysis, shortness of breath and wheezing.   Cardiovascular: Negative for chest pain and leg swelling.  Gastrointestinal: Negative for abdominal pain, constipation, diarrhea, nausea and vomiting.  Genitourinary: Negative for bladder incontinence, difficulty urinating, dysuria, frequency and hematuria.   Musculoskeletal: Negative for back pain, gait problem, neck pain and neck stiffness.  Skin: Negative for itching and rash.  Neurological: Negative for dizziness, extremity weakness, gait problem, headaches,  light-headedness and seizures.  Hematological: Negative for adenopathy. Does not bruise/bleed easily.  Psychiatric/Behavioral: Negative for confusion, depression and sleep disturbance. The patient is not nervous/anxious.     PHYSICAL EXAMINATION:  There were no vitals taken for this visit.  ECOG PERFORMANCE STATUS: {CHL ONC ECOG [id number]  Physical Exam  Constitutional: Oriented to person, place, and time and well-developed, well-nourished, and in no distress. No distress.  HENT:  Head: Normocephalic and atraumatic.  Mouth/Throat: Oropharynx is clear and moist. No oropharyngeal exudate.  Eyes: Conjunctivae are normal. Right eye exhibits no discharge. Left eye exhibits no discharge. No scleral icterus.  Neck: Normal range of motion. Neck supple.  Cardiovascular: Normal rate, regular rhythm, normal heart sounds and intact distal pulses.   Pulmonary/Chest: Effort normal and breath sounds normal. No respiratory distress. No wheezes. No rales.  Abdominal: Soft. Bowel sounds are normal. Exhibits no distension and no mass. There is no tenderness.  Musculoskeletal: Normal range of motion. Exhibits no edema.  Lymphadenopathy:    No cervical adenopathy.  Neurological: Alert and oriented to person, place, and time. Exhibits normal muscle tone. Gait normal. Coordination normal.  Skin: Skin is warm and dry. No rash noted. Not diaphoretic. No erythema. No pallor.  Psychiatric: Mood, memory and judgment normal.  Vitals reviewed.  LABORATORY DATA: Lab Results  Component Value Date   WBC 11.7 (H) 02/02/2021   HGB 13.0 02/02/2021   HCT 39.8 02/02/2021   MCV 90.9 02/02/2021   PLT 406 (H) 02/02/2021      Chemistry      Component Value Date/Time   NA 136 11/15/2020 0927   NA 137 08/17/2020 1212   K 4.6 11/15/2020 0927   CL 100 11/15/2020 0927   CO2 28 11/15/2020 0927   BUN 18 11/15/2020 0927   BUN 15 08/17/2020 1212   CREATININE 0.83 11/15/2020 0927   CREATININE 0.94 08/24/2020 1257       Component Value Date/Time   CALCIUM 9.2 11/15/2020 0927   ALKPHOS 84 08/24/2020 1257   AST 16 08/24/2020 1257   ALT 7 08/24/2020 1257   BILITOT 0.8 08/24/2020 1257       RADIOGRAPHIC STUDIES:  No results found.   ASSESSMENT/PLAN:  This is a very pleasant 46 year old African-American female referred to the clinic for iron deficiency anemia and thrombocytosis.   The patient had a repeat CBC today which showed improvement in her hemoglobin which was within normal limits today at ***  Her platelet count continues to be slightly elevated at ***K but improved compared to prior.  The patient's iron studies show improvement in her iron deficiency with a ferritin is normal at *** today.  Her iron studies are within normal limits today.    Recommended the patient continue with oral iron supplements and increasing the iron in her diet as she has been doing.  She does not need IV iron infusions at this time.   If her iron is stable in 6 months, we can consider releasing her to her PCP. ***  Additionally, the patient was strongly encouraged  to return her stool cards to rule out GI blood loss, especially given her family history of GI malignancy. She states she will try to return them to the clinic next week.   The patient was advised to call immediately if she has any concerning symptoms in the interval. The patient voices understanding of current disease status and treatment options and is in agreement with the current care plan. All questions were answered. The patient knows to call the clinic with any problems, questions or concerns. We can certainly see the patient much sooner if necessary    No orders of the defined types were placed in this encounter.    I spent {CHL ONC TIME VISIT - QIWLN:[id number]} counseling the patient face to face. The total time spent in the appointment was {CHL ONC TIME VISIT - EYCXK:[id number]}.  Tallin Hart L Micael Barb, PA-C 01/30/22

## 2022-01-31 ENCOUNTER — Other Ambulatory Visit: Payer: Self-pay

## 2022-01-31 ENCOUNTER — Ambulatory Visit
Admission: RE | Admit: 2022-01-31 | Discharge: 2022-01-31 | Disposition: A | Discharge status: Home / Self Care | Payer: Commercial Managed Care - HMO | Source: Ambulatory Visit | Attending: Obstetrics & Gynecology | Admitting: Obstetrics & Gynecology

## 2022-01-31 DIAGNOSIS — D509 Iron deficiency anemia, unspecified: Secondary | ICD-10-CM

## 2022-01-31 DIAGNOSIS — Z1231 Encounter for screening mammogram for malignant neoplasm of breast: Secondary | ICD-10-CM

## 2022-02-01 ENCOUNTER — Ambulatory Visit: Payer: 59 | Admitting: Physician Assistant

## 2022-02-01 ENCOUNTER — Other Ambulatory Visit: Payer: 59

## 2022-02-01 ENCOUNTER — Inpatient Hospital Stay: Payer: Commercial Managed Care - HMO | Attending: Physician Assistant

## 2022-02-01 ENCOUNTER — Other Ambulatory Visit: Payer: Self-pay | Admitting: Physician Assistant

## 2022-02-01 ENCOUNTER — Inpatient Hospital Stay: Payer: Commercial Managed Care - HMO | Admitting: Physician Assistant

## 2022-02-01 DIAGNOSIS — D509 Iron deficiency anemia, unspecified: Secondary | ICD-10-CM

## 2022-02-23 ENCOUNTER — Encounter: Payer: Self-pay | Admitting: Internal Medicine

## 2022-05-12 ENCOUNTER — Encounter: Payer: Self-pay | Admitting: Physician Assistant

## 2022-05-12 ENCOUNTER — Encounter (HOSPITAL_COMMUNITY): Payer: Self-pay

## 2022-05-12 ENCOUNTER — Ambulatory Visit (HOSPITAL_COMMUNITY)
Admission: EM | Admit: 2022-05-12 | Discharge: 2022-05-12 | Disposition: A | Discharge status: Home / Self Care | Payer: Commercial Managed Care - HMO | Attending: Emergency Medicine | Admitting: Emergency Medicine

## 2022-05-12 DIAGNOSIS — R030 Elevated blood-pressure reading, without diagnosis of hypertension: Secondary | ICD-10-CM

## 2022-05-12 DIAGNOSIS — J4521 Mild intermittent asthma with (acute) exacerbation: Secondary | ICD-10-CM | POA: Diagnosis not present

## 2022-05-12 LAB — POC INFLUENZA A AND B ANTIGEN (URGENT CARE ONLY)
Influenza A Ag: NEGATIVE
Influenza B Ag: NEGATIVE

## 2022-05-12 MED ORDER — ALBUTEROL SULFATE HFA 108 (90 BASE) MCG/ACT IN AERS: 2.0000 | INHALATION_SPRAY | Freq: Four times a day (QID) | RESPIRATORY_TRACT | 5 refills | Status: AC | PRN

## 2022-05-12 MED ORDER — ALBUTEROL SULFATE (2.5 MG/3ML) 0.083% IN NEBU
2.5000 mg | INHALATION_SOLUTION | Freq: Once | RESPIRATORY_TRACT | Status: AC
  Administered 2022-05-12: 2.5 mg via RESPIRATORY_TRACT

## 2022-05-12 MED ORDER — FLUTICASONE PROPIONATE 50 MCG/ACT NA SUSP: 1.0000 | Freq: Every day | NASAL | 1 refills | Status: AC

## 2022-05-12 MED ORDER — ALBUTEROL SULFATE (2.5 MG/3ML) 0.083% IN NEBU
INHALATION_SOLUTION | RESPIRATORY_TRACT | Status: AC
  Filled 2022-05-12: qty 3

## 2022-05-12 MED ORDER — HYDROCHLOROTHIAZIDE 25 MG PO TABS: 25.0000 mg | ORAL_TABLET | Freq: Every morning | ORAL | 2 refills | Status: AC

## 2022-05-12 MED ORDER — ALBUTEROL SULFATE HFA 108 (90 BASE) MCG/ACT IN AERS: 2.0000 | INHALATION_SPRAY | Freq: Four times a day (QID) | RESPIRATORY_TRACT | 0 refills | Status: DC | PRN

## 2022-05-12 MED ORDER — GUAIFENESIN 400 MG PO TABS: ORAL_TABLET | ORAL | 0 refills | Status: AC

## 2022-05-12 MED ORDER — PROMETHAZINE-DM 6.25-15 MG/5ML PO SYRP: 5.0000 mL | ORAL_SOLUTION | Freq: Every evening | ORAL | 0 refills | Status: AC | PRN

## 2022-05-12 MED ORDER — CETIRIZINE HCL 10 MG PO TABS: 10.0000 mg | ORAL_TABLET | Freq: Every day | ORAL | 1 refills | Status: AC

## 2022-05-12 NOTE — Discharge Instructions (Addendum)
Your symptoms and my physical exam findings are concerning for exacerbation of your underlying allergies.     Please see the list below for recommended medications, dosages and frequencies to provide relief of current symptoms:     Zyrtec (cetirizine): This is an excellent second-generation antihistamine that helps to reduce respiratory inflammatory response to environmental allergens.  In some patients, this medication can cause daytime sleepiness so I recommend that you take 1 tablet daily at bedtime.     Flonase (fluticasone): This is a steroid nasal spray that you use once daily, 1 spray in each nare.  This medication does not work well if you decide to use it only used as you feel you need to, it works best used on a daily basis.  After 3 to 5 days of use, you will notice significant reduction of the inflammation and mucus production that is currently being caused by exposure to allergens, whether seasonal or environmental.  The most common side effect of this medication is nosebleeds.  If you experience a nosebleed, please discontinue use for 1 week, then feel free to resume.  I have provided you with a prescription.     ProAir, Ventolin, Proventil (albuterol): This inhaled medication contains a short acting beta agonist bronchodilator.  This medication works on the smooth muscle that opens and constricts of your airways by relaxing the muscle.  The result of relaxation of the smooth muscle is increased air movement and improved work of breathing.  This is a short acting medication that can be used every 4-6 hours as needed for increased work of breathing, shortness of breath, wheezing and excessive coughing.  I have provided you with a prescription.     Robitussin, Mucinex (guaifenesin): This is an expectorant.  This helps break up chest congestion and loosen up thick nasal drainage making phlegm and drainage more liquid and therefore easier to remove.  I recommend being 400 mg three times daily as  needed.      Dextromethorphan (any cough medicine with the letters "DM" added to it's name such as Robitussin DM): This is a cough suppressant.  This is often recommended to be taken at nighttime to suppress cough and help children sleep.  Give dosage as directed on the bottle.  This medication is available over-the-counter.  Because your bottom blood pressure number is well over 80 mmHg today, and because you said that you tend to retain fluid, I recommend that you begin taking hydrochlorothiazide once daily in the morning.  This medication will help you eliminate fluid from your body by making you pee more frequently.  I have enclosed some information about hypertension and low-salt diet that if you find helpful.  Please be sure you follow-up with your primary care provider in the next 2 to 4 weeks to discuss whether or not further treatment of high blood pressure is needed.    If you do not have a primary care provider, please see the barcode of the back of this packet of papers which will take you to the [facility] health website to help you schedule appointment with a new provider.   Please follow-up within the next 5-7 days either with your primary care provider or urgent care if your respiratory symptoms do not resolve.  If you do not have a primary care provider, we will assist you in finding one.        Thank you for visiting urgent care today.  We appreciate the opportunity to participate in your care.

## 2022-05-12 NOTE — ED Provider Notes (Signed)
[facility]    CSN: [id number] Arrival date & time: 05/12/22  1329    HISTORY   Chief Complaint  Patient presents with   Cough   HPI Carla Barton is a pleasant, 47 y.o. female who presents to urgent care today. Patient complains of productive cough that began yesterday and has gotten progressively worse.  Patient states she has also been having generalized body aches.  Vital signs are essentially normal on arrival the exception of a very elevated blood pressure.  Patient has a history of asthma, not currently taking any asthma medications.    Past Medical History:  Diagnosis Date   Anemia    no current med.   Asthma    Closed fracture of middle phalanx of little finger 06/2017   right   History of asthma    no current med.   History of cervical cancer    Migraines    Patient Active Problem List   Diagnosis Date Noted   Iron deficiency anemia 08/24/2020   History of cervical cancer    Speech abnormality 01/10/2014   Anxiety in acute stress reaction 01/10/2014   Peritonsillar abscess 04/19/2013   Past Surgical History:  Procedure Laterality Date   CERVICAL BIOPSY  W/ LOOP ELECTRODE EXCISION     CHOLECYSTECTOMY     CLOSED REDUCTION FINGER WITH PERCUTANEOUS PINNING Right 07/06/2017   Procedure: CLOSED REDUCTION WITH PERCUTANEOUS PINNING RIGHT SMALL FINGER FRACTURE;  Surgeon: Betha Loa, MD;  Location: [facility];  Service: Orthopedics;  Laterality: Right;   TONSILLECTOMY Bilateral 04/19/2013   Procedure: TONSILLECTOMY;  Surgeon: Christia Reading, MD;  Location: [facility];  Service: ENT;  Laterality: Bilateral;   TUBAL LIGATION     OB History     Gravida  3   Para  3   Term  3   Preterm      AB      Living  3      SAB      IAB      Ectopic      Multiple      Live Births             Home Medications    Prior to Admission medications   Medication Sig Start Date End Date Taking? Authorizing Provider  albuterol (VENTOLIN  HFA) 108 (90 Base) MCG/ACT inhaler Inhale 1 puff into the lungs every 6 (six) hours as needed for wheezing or shortness of breath. 11/09/20   Merrilee Jansky, MD  ferrous sulfate 324 MG TBEC Take 324 mg by mouth daily.    [provider]    Family History Family History  Problem Relation Age of Onset   Cancer Mother        stomach   Hypertension Mother    Hypertension Father    Diabetes Father    Breast cancer Sister 1   Cancer Sister        Colon   Social History Social History   Tobacco Use   Smoking status: Never   Smokeless tobacco: Never  Vaping Use   Vaping Use: Never used  Substance Use Topics   Alcohol use: No   Drug use: No   Allergies   Aspirin and Shellfish allergy  Review of Systems Review of Systems Pertinent findings revealed after performing a 14 point review of systems has been noted in the history of present illness.  Physical Exam Triage Vital Signs ED Triage Vitals  Enc Vitals Group  BP 02/05/21 0827 (!) 147/82     Pulse Rate 02/05/21 0827 72     Resp 02/05/21 0827 18     Temp 02/05/21 0827 98.3 F (36.8 C)     Temp Source 02/05/21 0827 Oral     SpO2 02/05/21 0827 98 %     Weight --      Height --      Head Circumference --      Peak Flow --      Pain Score 02/05/21 0826 5     Pain Loc --      Pain Edu? --      Excl. in GC? --   No data found.  Updated Vital Signs BP (!) 144/100 (BP Location: Right Arm)   Pulse 96   Temp 98.5 F (36.9 C) (Oral)   Resp 18   LMP 05/08/2022 (Exact Date)   SpO2 98%   Physical Exam Vitals and nursing note reviewed.  Constitutional:      General: She is not in acute distress.    Appearance: Normal appearance. She is not ill-appearing.  HENT:     Head: Normocephalic and atraumatic.     Salivary Glands: Right salivary gland is not diffusely enlarged or tender. Left salivary gland is not diffusely enlarged or tender.     Right Ear: Ear canal and external ear normal. No drainage. A middle  ear effusion is present. There is no impacted cerumen. Tympanic membrane is bulging. Tympanic membrane is not injected or erythematous.     Left Ear: Ear canal and external ear normal. No drainage. A middle ear effusion is present. There is no impacted cerumen. Tympanic membrane is bulging. Tympanic membrane is not injected or erythematous.     Ears:     Comments: Bilateral EACs normal, both TMs bulging with clear fluid    Nose: Rhinorrhea present. No nasal deformity, septal deviation, signs of injury, nasal tenderness, mucosal edema or congestion. Rhinorrhea is clear.     Right Nostril: Occlusion present. No foreign body, epistaxis or septal hematoma.     Left Nostril: Occlusion present. No foreign body, epistaxis or septal hematoma.     Right Turbinates: Enlarged, swollen and pale.     Left Turbinates: Enlarged, swollen and pale.     Right Sinus: No maxillary sinus tenderness or frontal sinus tenderness.     Left Sinus: No maxillary sinus tenderness or frontal sinus tenderness.     Mouth/Throat:     Lips: Pink. No lesions.     Mouth: Mucous membranes are moist. No oral lesions.     Pharynx: Oropharynx is clear. Uvula midline. No posterior oropharyngeal erythema or uvula swelling.     Tonsils: No tonsillar exudate. 0 on the right. 0 on the left.     Comments: Postnasal drip Eyes:     General: Lids are normal.        Right eye: No discharge.        Left eye: No discharge.     Extraocular Movements: Extraocular movements intact.     Conjunctiva/sclera: Conjunctivae normal.     Right eye: Right conjunctiva is not injected.     Left eye: Left conjunctiva is not injected.  Neck:     Trachea: Trachea and phonation normal.  Cardiovascular:     Rate and Rhythm: Normal rate and regular rhythm.     Pulses: Normal pulses.     Heart sounds: Normal heart sounds. No murmur heard.    No friction rub.  No gallop.  Pulmonary:     Effort: Pulmonary effort is normal. No tachypnea, bradypnea, accessory  muscle usage, prolonged expiration, respiratory distress or retractions.     Breath sounds: No stridor, decreased air movement or transmitted upper airway sounds. Examination of the right-upper field reveals decreased breath sounds. Examination of the left-upper field reveals decreased breath sounds. Examination of the right-middle field reveals decreased breath sounds. Examination of the left-middle field reveals decreased breath sounds. Examination of the right-lower field reveals decreased breath sounds. Examination of the left-lower field reveals decreased breath sounds. Decreased breath sounds present. No wheezing, rhonchi or rales.     Comments: Repeat auscultation post nebulized bronchodilator revealed improved breath sounds, patient reported decreased cough and improved work of breathing. Chest:     Chest wall: No tenderness.  Musculoskeletal:        General: Normal range of motion.     Cervical back: Normal range of motion and neck supple. Normal range of motion.  Lymphadenopathy:     Cervical: No cervical adenopathy.  Skin:    General: Skin is warm and dry.     Findings: No erythema or rash.  Neurological:     General: No focal deficit present.     Mental Status: She is alert and oriented to person, place, and time.  Psychiatric:        Mood and Affect: Mood normal.        Behavior: Behavior normal.     Visual Acuity Right Eye Distance:   Left Eye Distance:   Bilateral Distance:    Right Eye Near:   Left Eye Near:    Bilateral Near:     UC Couse / Diagnostics / Procedures:     Radiology No results found.  Procedures Procedures (including critical care time) EKG  Pending results:  Labs Reviewed  POC INFLUENZA A AND B ANTIGEN (URGENT CARE ONLY)    Medications Ordered in UC: Medications  albuterol (PROVENTIL) (2.5 MG/3ML) 0.083% nebulizer solution 2.5 mg (2.5 mg Nebulization Given 05/12/22 1607)    UC Diagnoses / Final Clinical Impressions(s)   I have reviewed  the triage vital signs and the nursing notes.  Pertinent labs & imaging results that were available during my care of the patient were reviewed by me and considered in my medical decision making (see chart for details).    Final diagnoses:  Elevated blood pressure reading without diagnosis of hypertension  Mild intermittent asthma with allergic rhinitis with acute exacerbation   Patient provided with allergy and asthma medication as below.  Patient also provided with hydrochlorothiazide to address her elevated diastolic blood pressure.  Patient advised to follow-up with primary care in the next 2 to 4 weeks for both issues, patient advised to return to urgent care in 5 to 7 days if respiratory symptoms do not improve.  Please see discharge instructions below for further details of plan of care as provided to patient. ED Prescriptions     Medication Sig Dispense Auth. Provider   hydrochlorothiazide (HYDRODIURIL) 25 MG tablet Take 1 tablet (25 mg total) by mouth in the morning. 30 tablet Lynden Oxford Scales, PA-C   fluticasone (FLONASE) 50 MCG/ACT nasal spray Place 1 spray into both nostrils daily. 47.4 mL Lynden Oxford Scales, PA-C   cetirizine (ZYRTEC ALLERGY) 10 MG tablet Take 1 tablet (10 mg total) by mouth at bedtime. 90 tablet Lynden Oxford Scales, PA-C   albuterol (VENTOLIN HFA) 108 (90 Base) MCG/ACT inhaler Inhale 2 puffs into the lungs every  6 (six) hours as needed for wheezing or shortness of breath (Cough). 18 g Lynden Oxford Scales, PA-C      PDMP not reviewed this encounter.  Disposition Upon Discharge:  Condition: stable for discharge home Home: take medications as prescribed; routine discharge instructions as discussed; follow up as advised.  Patient presented with an acute illness with associated systemic symptoms and significant discomfort requiring urgent management. In my opinion, this is a condition that a prudent lay person (someone who possesses an average  knowledge of health and medicine) may potentially expect to result in complications if not addressed urgently such as respiratory distress, impairment of bodily function or dysfunction of bodily organs.   Routine symptom specific, illness specific and/or disease specific instructions were discussed with the patient and/or caregiver at length.   As such, the patient has been evaluated and assessed, work-up was performed and treatment was provided in alignment with urgent care protocols and evidence based medicine.  Patient/parent/caregiver has been advised that the patient may require follow up for further testing and treatment if the symptoms continue in spite of treatment, as clinically indicated and appropriate.  If the patient was tested for COVID-19, Influenza and/or RSV, then the patient/parent/guardian was advised to isolate at home pending the results of his/her diagnostic coronavirus test and potentially longer if they're positive. I have also advised pt that if his/her COVID-19 test returns positive, it's recommended to self-isolate for at least 10 days after symptoms first appeared AND until fever-free for 24 hours without fever reducer AND other symptoms have improved or resolved. Discussed self-isolation recommendations as well as instructions for household member/close contacts as per the [facility] and [place] DHHS, and also gave patient the [place] packet with this information.  Patient/parent/caregiver has been advised to return to the [facility] or PCP in 3-5 days if no better; to PCP or the Emergency Department if new signs and symptoms develop, or if the current signs or symptoms continue to change or worsen for further workup, evaluation and treatment as clinically indicated and appropriate  The patient will follow up with their current PCP if and as advised. If the patient does not currently have a PCP we will assist them in obtaining one.   The patient may need specialty follow up if the symptoms  continue, in spite of conservative treatment and management, for further workup, evaluation, consultation and treatment as clinically indicated and appropriate.  Patient/parent/caregiver verbalized understanding and agreement of plan as discussed.  All questions were addressed during visit.  Please see discharge instructions below for further details of plan.  Discharge Instructions:   Discharge Instructions      Your symptoms and my physical exam findings are concerning for exacerbation of your underlying allergies.     Please see the list below for recommended medications, dosages and frequencies to provide relief of current symptoms:     Zyrtec (cetirizine): This is an excellent second-generation antihistamine that helps to reduce respiratory inflammatory response to environmental allergens.  In some patients, this medication can cause daytime sleepiness so I recommend that you take 1 tablet daily at bedtime.     Flonase (fluticasone): This is a steroid nasal spray that you use once daily, 1 spray in each nare.  This medication does not work well if you decide to use it only used as you feel you need to, it works best used on a daily basis.  After 3 to 5 days of use, you will notice significant reduction of the inflammation and  mucus production that is currently being caused by exposure to allergens, whether seasonal or environmental.  The most common side effect of this medication is nosebleeds.  If you experience a nosebleed, please discontinue use for 1 week, then feel free to resume.  I have provided you with a prescription.     ProAir, Ventolin, Proventil (albuterol): This inhaled medication contains a short acting beta agonist bronchodilator.  This medication works on the smooth muscle that opens and constricts of your airways by relaxing the muscle.  The result of relaxation of the smooth muscle is increased air movement and improved work of breathing.  This is a short acting medication  that can be used every 4-6 hours as needed for increased work of breathing, shortness of breath, wheezing and excessive coughing.  I have provided you with a prescription.     Robitussin, Mucinex (guaifenesin): This is an expectorant.  This helps break up chest congestion and loosen up thick nasal drainage making phlegm and drainage more liquid and therefore easier to remove.  I recommend being 400 mg three times daily as needed.      Dextromethorphan (any cough medicine with the letters "DM" added to it's name such as Robitussin DM): This is a cough suppressant.  This is often recommended to be taken at nighttime to suppress cough and help children sleep.  Give dosage as directed on the bottle.  This medication is available over-the-counter.  Because your bottom blood pressure number is well over 80 mmHg today, and because you said that you tend to retain fluid, I recommend that you begin taking hydrochlorothiazide once daily in the morning.  This medication will help you eliminate fluid from your body by making you pee more frequently.  I have enclosed some information about hypertension and low-salt diet that if you find helpful.  Please be sure you follow-up with your primary care provider in the next 2 to 4 weeks to discuss whether or not further treatment of high blood pressure is needed.    If you do not have a primary care provider, please see the barcode of the back of this packet of papers which will take you to the [facility] health website to help you schedule appointment with a new provider.   Please follow-up within the next 5-7 days either with your primary care provider or urgent care if your respiratory symptoms do not resolve.  If you do not have a primary care provider, we will assist you in finding one.        Thank you for visiting urgent care today.  We appreciate the opportunity to participate in your care.       This office note has been dictated using Health visitor.  Unfortunately, this method of dictation can sometimes lead to typographical or grammatical errors.  I apologize for your inconvenience in advance if this occurs.  Please do not hesitate to reach out to me if clarification is needed.      Lynden Oxford Scales, PA-C 05/12/22 1625

## 2022-05-12 NOTE — ED Triage Notes (Signed)
Patient states she woke up yesterday coughing and it has progressively gotten worse. States she vomited once but it was mostly mucus. Patient also c/o generalized body aches.

## 2022-10-17 ENCOUNTER — Encounter: Payer: Self-pay | Admitting: Physician Assistant

## 2022-10-24 ENCOUNTER — Ambulatory Visit: Payer: Self-pay | Admitting: Obstetrics & Gynecology

## 2023-07-19 ENCOUNTER — Ambulatory Visit (HOSPITAL_COMMUNITY)
Admission: EM | Admit: 2023-07-19 | Discharge: 2023-07-19 | Disposition: A | Discharge status: Home / Self Care | Payer: Self-pay | Attending: Nurse Practitioner | Admitting: Nurse Practitioner

## 2023-07-19 ENCOUNTER — Encounter (HOSPITAL_COMMUNITY): Payer: Self-pay

## 2023-07-19 ENCOUNTER — Encounter: Payer: Self-pay | Admitting: Physician Assistant

## 2023-07-19 DIAGNOSIS — K0889 Other specified disorders of teeth and supporting structures: Secondary | ICD-10-CM

## 2023-07-19 DIAGNOSIS — S025XXB Fracture of tooth (traumatic), initial encounter for open fracture: Secondary | ICD-10-CM

## 2023-07-19 IMAGING — CR DG ELBOW COMPLETE 3+V*R*
4 series · 4 of 4 positions shown · non-contrast
Comparison: None.

CLINICAL DATA: Atraumatic right arm pain for 3 days.

EXAM:
RIGHT ELBOW - COMPLETE 3+ VIEW

[x elbow ap right]
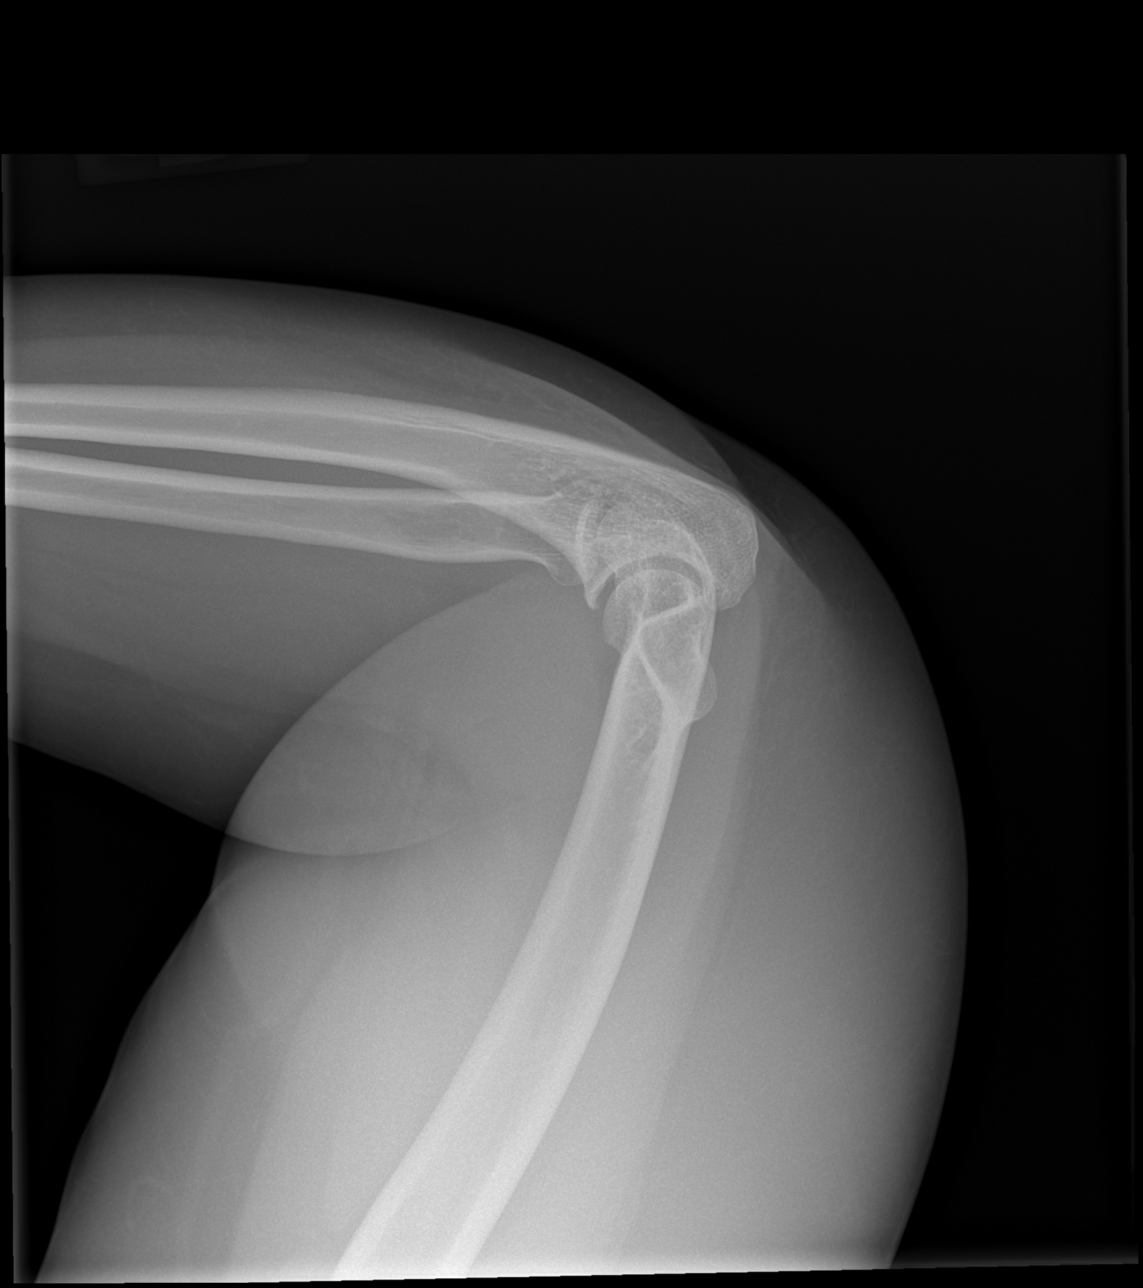

[x elbow obl right (1 of 2)]
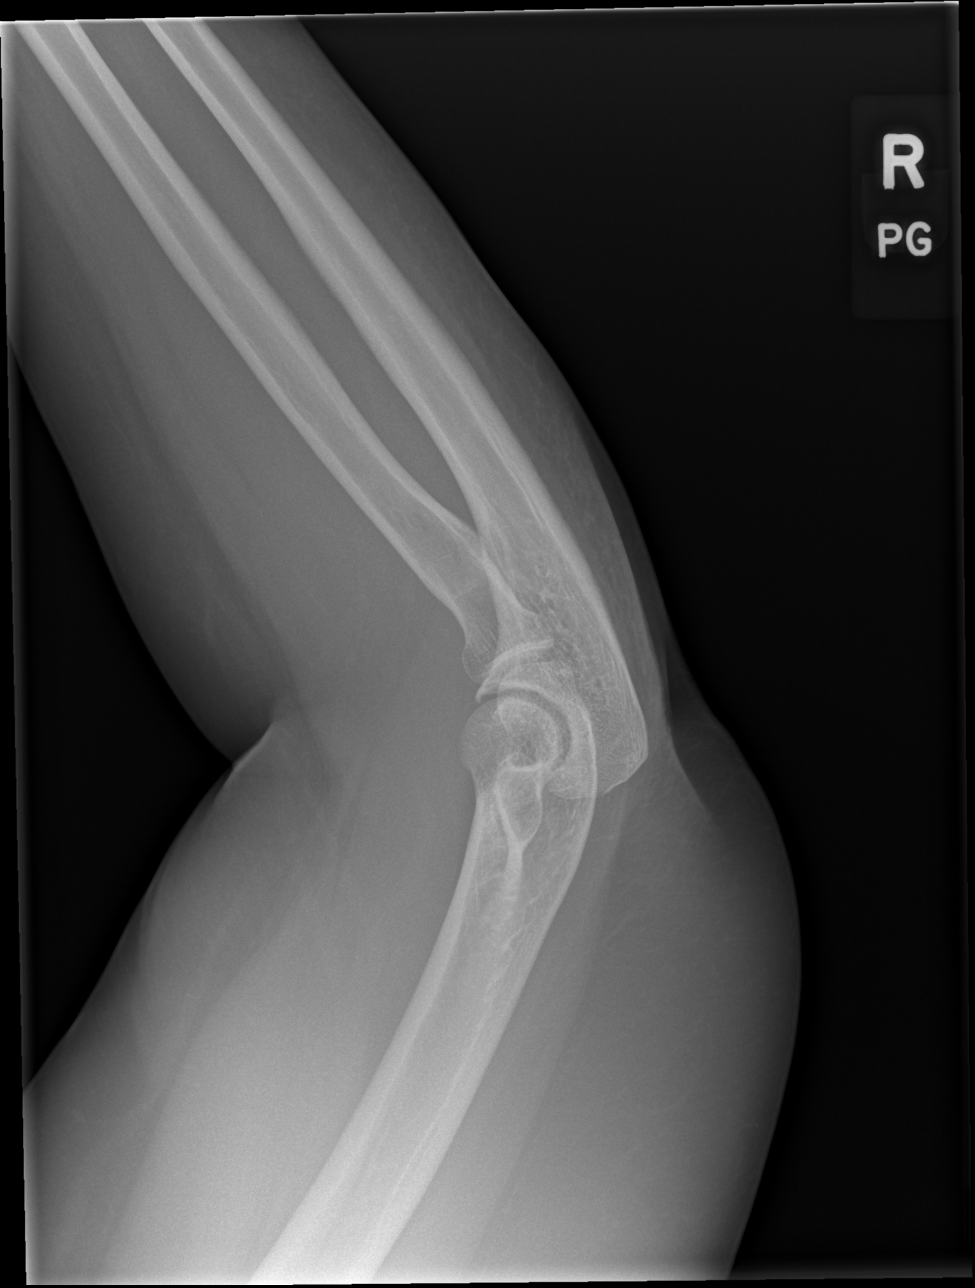

[x elbow obl right (2 of 2)]
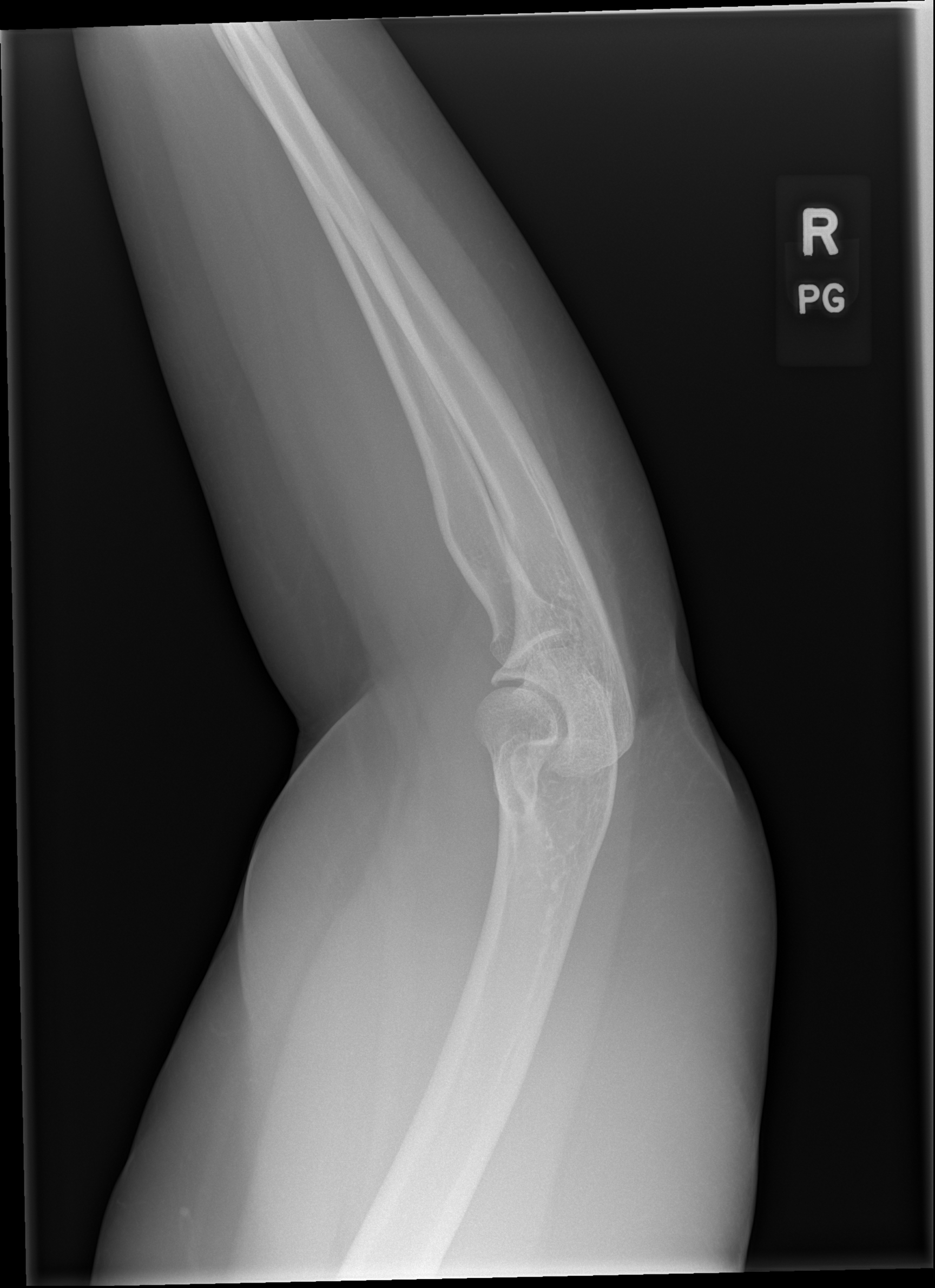

[x elbow lat right]
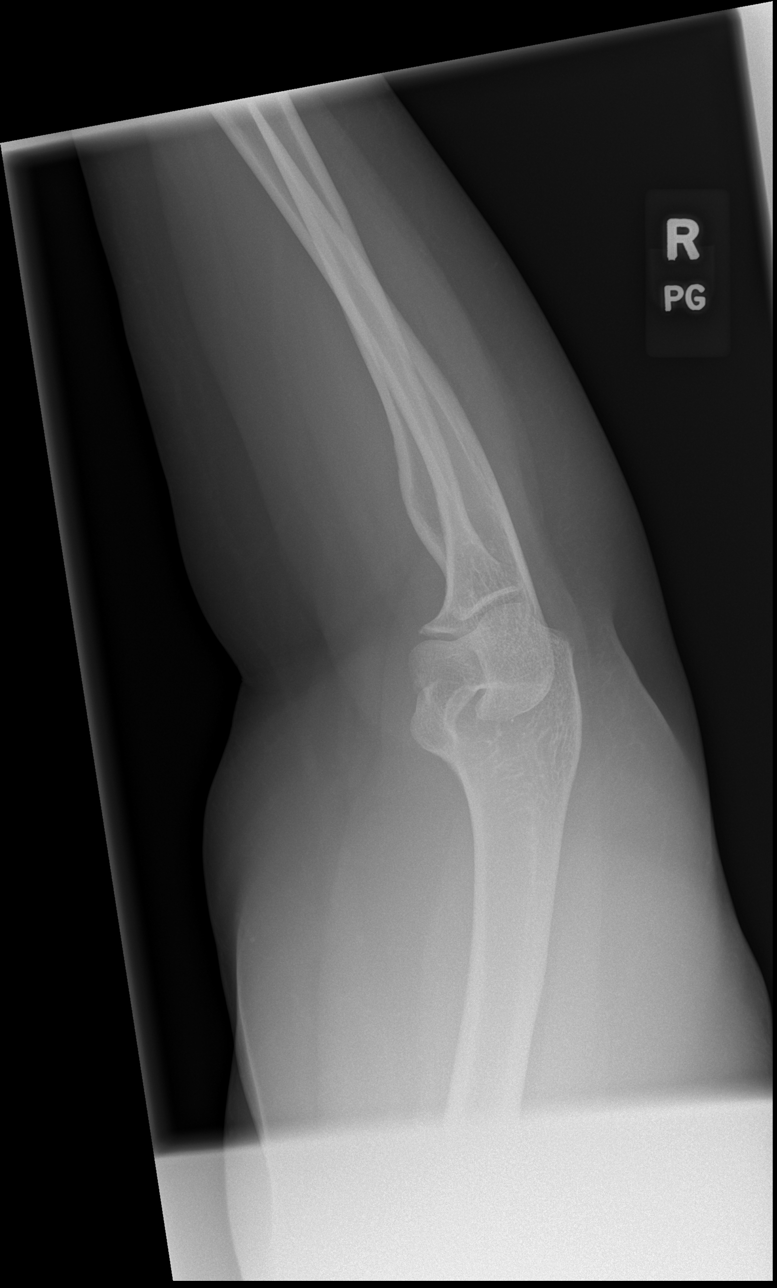

[4 of 4 positions shown; findings below may reference images not displayed]

FINDINGS: Limited frontal positioning due to difficulty with patient pain in
positioning. There is no evidence of fracture, dislocation, or joint
effusion.
IMPRESSION: 1. Limited study due to patient's symptoms.
2. No explanation for pain.

## 2023-07-19 MED ORDER — AMOXICILLIN-POT CLAVULANATE 875-125 MG PO TABS: 1.0000 | ORAL_TABLET | Freq: Two times a day (BID) | ORAL | 0 refills | Status: AC

## 2023-07-19 MED ORDER — IBUPROFEN 800 MG PO TABS: 800.0000 mg | ORAL_TABLET | Freq: Three times a day (TID) | ORAL | 0 refills | Status: AC | PRN

## 2023-07-19 MED ORDER — CHLORHEXIDINE GLUCONATE 0.12 % MT SOLN: 15.0000 mL | Freq: Two times a day (BID) | OROMUCOSAL | 0 refills | Status: AC

## 2023-07-19 NOTE — ED Triage Notes (Signed)
 Pt c/o lt lower toothache x2 days that's now causing headache and nauseous. States tylenol isn't helping.

## 2023-07-19 NOTE — Discharge Instructions (Addendum)
 I have prescribed an antibiotic, ibuprofen 800 mg to use every 8 hours as needed for pain (with food), and a mouth rinse to use twice daily.  Please follow up with your dentist for further evaluation.  Also, your blood pressure was elevated today - you may recheck it at home and if it remains > 140/90, please follow up with a primary care provider.

## 2023-07-19 NOTE — ED Provider Notes (Signed)
 [facility]    CSN: [id number] Arrival date & time: 07/19/23  4098      History   Chief Complaint Chief Complaint  Patient presents with   Dental Pain    HPI Carla Barton is a 48 y.o. female.   Patient presents requesting evaluation for a 2-day history of left lower toothache.  States she recently had dental work done where she had 4 teeth pulled on the right upper side of her mouth.  Those symptoms have resolved but now she is endorsing the discomfort to her left lower mouth.  States she knows that she has a broken tooth on the left lower side of her mouth for the past 10 years but it hasn't bothered her.   Now she is experiencing significant pain.  No reported headache, fever, chills, or body aches.  She has been taking two Tylenol Arthritis capsules every 6 hours to help with the pain.  No other symptoms such as sore throat, cough, chest pain, shortness of breath, abdominal pain, vomiting, or diarrhea.  The history is provided by the patient.  Dental Pain Associated symptoms: no congestion, no fever, no headaches and no oral lesions     Past Medical History:  Diagnosis Date   Anemia    no current med.   Asthma    Closed fracture of middle phalanx of little finger 06/2017   right   History of asthma    no current med.   History of cervical cancer    Migraines     Patient Active Problem List   Diagnosis Date Noted   Iron deficiency anemia 08/24/2020   History of cervical cancer    Speech abnormality 01/10/2014   Anxiety in acute stress reaction 01/10/2014   Peritonsillar abscess 04/19/2013    Past Surgical History:  Procedure Laterality Date   CERVICAL BIOPSY  W/ LOOP ELECTRODE EXCISION     CHOLECYSTECTOMY     CLOSED REDUCTION FINGER WITH PERCUTANEOUS PINNING Right 07/06/2017   Procedure: CLOSED REDUCTION WITH PERCUTANEOUS PINNING RIGHT SMALL FINGER FRACTURE;  Surgeon: Betha Loa, MD;  Location: [facility];  Service: Orthopedics;   Laterality: Right;   TONSILLECTOMY Bilateral 04/19/2013   Procedure: TONSILLECTOMY;  Surgeon: Christia Reading, MD;  Location: [facility] OR;  Service: ENT;  Laterality: Bilateral;   TUBAL LIGATION      OB History     Gravida  3   Para  3   Term  3   Preterm      AB      Living  3      SAB      IAB      Ectopic      Multiple      Live Births               Home Medications    Prior to Admission medications   Medication Sig Start Date End Date Taking? Authorizing Provider  amoxicillin-clavulanate (AUGMENTIN) 875-125 MG tablet Take 1 tablet by mouth every 12 (twelve) hours for 10 days. 07/19/23 07/29/23 Yes Burgess Estelle, FNP  chlorhexidine (PERIDEX) 0.12 % solution 15 mLs by Mouth Rinse route 2 (two) times daily for 10 days. 07/19/23 07/29/23 Yes Burgess Estelle, FNP  ibuprofen (ADVIL) 800 MG tablet Take 1 tablet (800 mg total) by mouth every 8 (eight) hours as needed (with meals). 07/19/23  Yes Burgess Estelle, FNP  albuterol (VENTOLIN HFA) 108 (90 Base) MCG/ACT inhaler Inhale 2 puffs into the  lungs every 6 (six) hours as needed for wheezing or shortness of breath (Cough). 05/12/22   Theadora Rama Scales, PA-C  cetirizine (ZYRTEC ALLERGY) 10 MG tablet Take 1 tablet (10 mg total) by mouth at bedtime. 05/12/22 11/08/22  Theadora Rama Scales, PA-C  ferrous sulfate 324 MG TBEC Take 324 mg by mouth daily.    [provider]  fluticasone (FLONASE) 50 MCG/ACT nasal spray Place 1 spray into both nostrils daily. 05/12/22   Theadora Rama Scales, PA-C  guaifenesin (HUMIBID E) 400 MG TABS tablet Take 1 tablet 3 times daily as needed for chest congestion and cough 05/12/22   Theadora Rama Scales, PA-C  hydrochlorothiazide (HYDRODIURIL) 25 MG tablet Take 1 tablet (25 mg total) by mouth in the morning. 05/12/22 08/10/22  Theadora Rama Scales, PA-C  promethazine-dextromethorphan (PROMETHAZINE-DM) 6.25-15 MG/5ML syrup Take 5 mLs by mouth at bedtime as needed for cough. 05/12/22   Theadora Rama Scales,  PA-C    Family History Family History  Problem Relation Age of Onset   Cancer Mother        stomach   Hypertension Mother    Hypertension Father    Diabetes Father    Breast cancer Sister 51   Cancer Sister        Colon    Social History Social History   Tobacco Use   Smoking status: Never   Smokeless tobacco: Never  Vaping Use   Vaping status: Never Used  Substance Use Topics   Alcohol use: No   Drug use: No     Allergies   Aspirin and Shellfish allergy   Review of Systems Review of Systems  Constitutional:  Negative for chills, fatigue and fever.  HENT:  Positive for dental problem. Negative for congestion, mouth sores, sinus pressure and sore throat.   Respiratory:  Negative for cough and shortness of breath.   Cardiovascular:  Negative for chest pain and palpitations.  Gastrointestinal:  Negative for abdominal pain, diarrhea and vomiting.  Skin:  Negative for rash.  Neurological:  Negative for dizziness and headaches.     Physical Exam Triage Vital Signs ED Triage Vitals  Encounter Vitals Group     BP 07/19/23 0828 (!) 180/123     Systolic BP Percentile --      Diastolic BP Percentile --      Pulse Rate 07/19/23 0828 75     Resp 07/19/23 0828 18     Temp 07/19/23 0828 98.3 F (36.8 C)     Temp Source 07/19/23 0828 Oral     SpO2 07/19/23 0828 100 %     Weight --      Height --      Head Circumference --      Peak Flow --      Pain Score 07/19/23 0829 10     Pain Loc --      Pain Education --      Exclude from Growth Chart --    No data found.  Updated Vital Signs BP (!) 180/123 (BP Location: Left Arm)   Pulse 75   Temp 98.3 F (36.8 C) (Oral)   Resp 18   LMP 06/26/2023   SpO2 100%   Visual Acuity Right Eye Distance:   Left Eye Distance:   Bilateral Distance:    Right Eye Near:   Left Eye Near:    Bilateral Near:     Physical Exam Vitals and nursing note reviewed.  Constitutional:      Appearance: Normal appearance.  She is  obese.  HENT:     Head: Normocephalic.     Right Ear: Tympanic membrane and ear canal normal.     Left Ear: Tympanic membrane and ear canal normal.     Nose: Nose normal.     Mouth/Throat:     Dentition: Abnormal dentition. Gingival swelling present.      Comments: No sublingual swelling. Eyes:     Conjunctiva/sclera: Conjunctivae normal.     Pupils: Pupils are equal, round, and reactive to light.  Cardiovascular:     Rate and Rhythm: Normal rate and regular rhythm.     Comments: Repeat blood pressure 152/99 left arm sitting. Pulmonary:     Effort: Pulmonary effort is normal.     Breath sounds: Normal breath sounds.  Abdominal:     General: Bowel sounds are normal.  Musculoskeletal:     Cervical back: Normal range of motion.  Lymphadenopathy:     Head:     Right side of head: No submental or submandibular adenopathy.     Left side of head: No submental or submandibular adenopathy.     Cervical: No cervical adenopathy.     Right cervical: No posterior cervical adenopathy.    Left cervical: No posterior cervical adenopathy.  Neurological:     General: No focal deficit present.     Mental Status: She is alert and oriented to person, place, and time.  Psychiatric:        Mood and Affect: Mood normal.        Behavior: Behavior normal.        Thought Content: Thought content normal.        Judgment: Judgment normal.    UC Treatments / Results  Labs (all labs ordered are listed, but only abnormal results are displayed) Labs Reviewed - No data to display  EKG   Radiology No results found.  Procedures Procedures (including critical care time)  Medications Ordered in UC Medications - No data to display  Initial Impression / Assessment and Plan / UC Course  I have reviewed the triage vital signs and the nursing notes.  Pertinent labs & imaging results that were available during my care of the patient were reviewed by me and considered in my medical decision making  (see chart for details).  Clinical Course as of 07/19/23 0926  Wed Jul 19, 2023  0852 BP(!): 180/123 [AW]    Clinical Course User Index [AW] Burgess Estelle, FNP    Patient presents with a 2-day history of left lower dental pain.  She has a fracture/decay of #20 with mild adjacent gingival erythema and inflammation.  No discrete abscess seen.  No sublingual inflammation to suggest Ludwig's angina.  No external facial findings to suggest cellulitis.  No adjacent lymphadenopathy.  Reasonable to provide antibiotic therapy, Peridex oral rinse, and ibuprofen 800 mg for pain management.  I advised her to follow label directions when taking Tylenol.  She will follow-up with a dentist for ongoing care management.  Red flags were reviewed which would warrant return evaluation.  Additionally, her first BP measurement was noted to be very high - a repeat measurement demonstrate a satisfactory decrease, although still elevated.  I discussed rechecking it at home and also establishing care with a PCP.    Final diagnoses:  Pain, dental  Open fracture of tooth, initial encounter     Discharge Instructions      I have prescribed an antibiotic, ibuprofen 800 mg to use every 8  hours as needed for pain (with food), and a mouth rinse to use twice daily.  Please follow up with your dentist for further evaluation.  Also, your blood pressure was elevated today - you may recheck it at home and if it remains > 140/90, please follow up with a primary care provider.     ED Prescriptions     Medication Sig Dispense Auth. Provider   amoxicillin-clavulanate (AUGMENTIN) 875-125 MG tablet Take 1 tablet by mouth every 12 (twelve) hours for 10 days. 20 tablet Burgess Estelle, FNP   chlorhexidine (PERIDEX) 0.12 % solution 15 mLs by Mouth Rinse route 2 (two) times daily for 10 days. 300 mL Burgess Estelle, FNP   ibuprofen (ADVIL) 800 MG tablet Take 1 tablet (800 mg total) by mouth every 8 (eight) hours as needed (with  meals). 30 tablet Burgess Estelle, FNP      PDMP not reviewed this encounter.   Burgess Estelle, FNP 07/19/23 [phone]
